# Patient Record
Sex: Female | Born: 1955 | Race: White | Hispanic: No | Marital: Single | Smoking: Former smoker
Health system: Southern US, Community
[De-identification: ages and names within clinical notes are randomized; demographics above are authoritative.]

## PROBLEM LIST (undated history)

## (undated) HISTORY — PX: FOOT SURGERY: SHX648

---

## 2008-06-15 ENCOUNTER — Ambulatory Visit: Payer: Self-pay | Admitting: Internal Medicine

## 2008-07-02 ENCOUNTER — Ambulatory Visit: Payer: Self-pay | Admitting: Internal Medicine

## 2009-01-17 ENCOUNTER — Ambulatory Visit: Payer: Self-pay | Admitting: Internal Medicine

## 2011-01-06 ENCOUNTER — Ambulatory Visit: Payer: Self-pay | Admitting: Internal Medicine

## 2011-04-02 ENCOUNTER — Ambulatory Visit: Payer: Self-pay | Admitting: Internal Medicine

## 2013-07-10 ENCOUNTER — Ambulatory Visit: Payer: Self-pay | Admitting: Podiatry

## 2014-07-02 ENCOUNTER — Ambulatory Visit: Payer: Self-pay | Admitting: Podiatry

## 2014-12-24 ENCOUNTER — Emergency Department
Admission: EM | Admit: 2014-12-24 | Discharge: 2014-12-24 | Disposition: A | Discharge status: Home / Self Care | Payer: 59 | Attending: Emergency Medicine | Admitting: Emergency Medicine

## 2014-12-24 ENCOUNTER — Encounter: Payer: Self-pay | Admitting: Urgent Care

## 2014-12-24 DIAGNOSIS — R51 Headache: Secondary | ICD-10-CM | POA: Insufficient documentation

## 2014-12-24 DIAGNOSIS — R040 Epistaxis: Secondary | ICD-10-CM | POA: Insufficient documentation

## 2014-12-24 LAB — CBC WITH DIFFERENTIAL/PLATELET
Basophils Absolute: 0.1 10*3/uL (ref 0–0.1)
Basophils Relative: 1 %
Eosinophils Absolute: 0.1 10*3/uL (ref 0–0.7)
Eosinophils Relative: 1 %
HCT: 42.2 % (ref 35.0–47.0)
HEMOGLOBIN: 14 g/dL (ref 12.0–16.0)
LYMPHS ABS: 3 10*3/uL (ref 1.0–3.6)
Lymphocytes Relative: 31 %
MCH: 32.7 pg (ref 26.0–34.0)
MCHC: 33.2 g/dL (ref 32.0–36.0)
MCV: 98.6 fL (ref 80.0–100.0)
MONOS PCT: 9 %
Monocytes Absolute: 0.8 10*3/uL (ref 0.2–0.9)
NEUTROS PCT: 58 %
Neutro Abs: 5.8 10*3/uL (ref 1.4–6.5)
PLATELETS: 262 10*3/uL (ref 150–440)
RBC: 4.28 MIL/uL (ref 3.80–5.20)
RDW: 13.4 % (ref 11.5–14.5)
WBC: 9.7 10*3/uL (ref 3.6–11.0)

## 2014-12-24 LAB — COMPREHENSIVE METABOLIC PANEL
ALBUMIN: 4.5 g/dL (ref 3.5–5.0)
ALK PHOS: 83 U/L (ref 38–126)
ALT: 15 U/L (ref 14–54)
AST: 22 U/L (ref 15–41)
Anion gap: 9 (ref 5–15)
BUN: 17 mg/dL (ref 6–20)
CHLORIDE: 105 mmol/L (ref 101–111)
CO2: 27 mmol/L (ref 22–32)
Calcium: 9.7 mg/dL (ref 8.9–10.3)
Creatinine, Ser: 0.77 mg/dL (ref 0.44–1.00)
GFR calc Af Amer: >60 mL/min (ref >60)
Glucose, Bld: 103 mg/dL — ABNORMAL HIGH (ref 65–99)
POTASSIUM: 3.8 mmol/L (ref 3.5–5.1)
Sodium: 141 mmol/L (ref 135–145)
Total Bilirubin: 0.3 mg/dL (ref 0.3–1.2)
Total Protein: 7.3 g/dL (ref 6.5–8.1)

## 2014-12-24 LAB — PROTIME-INR
INR: 0.98
PROTHROMBIN TIME: 13.2 s (ref 11.4–15.0)

## 2014-12-24 MED ORDER — OXYMETAZOLINE HCL 0.05 % NA SOLN: 1.0000 | Freq: Once | NASAL | Status: DC

## 2014-12-24 MED ORDER — OXYMETAZOLINE HCL 0.05 % NA SOLN
NASAL | Status: AC
  Filled 2014-12-24: qty 15

## 2014-12-24 NOTE — ED Notes (Signed)
Patient present to ED with complaint of nose bleed beginning at 7 pm tonight. Patient reports has been having nose bleed on and off since yesterday. Was seen at [facility] today for same complaint, states bleeding was controlled but began again tonight. Patient report had "tingling in my head and left arm feels heavy, like I worked out." Denies chest pain, shortness of breath or fevers. Patient blood pressure elevated tonight, denies history of HTN. Reports is not on blood thinners. Patient coughing up blood clots during assessment. Patient alert and oriented X4, respirations even and unlabored.

## 2014-12-24 NOTE — ED Notes (Signed)
MD at bedside. 

## 2014-12-24 NOTE — ED Notes (Signed)

## 2014-12-24 NOTE — ED Notes (Addendum)
Patient presents with active epistaxis event; this one started at 1900. Patient actually started last night and has had intermittent bleeding all day. That of which [facility]; given Afrin. Patient reports that she is bleeding only from the LEFT nare.

## 2014-12-24 NOTE — ED Notes (Signed)
Patient states does not want her nose packet, patient made aware she will possibly have episodes of bleeding, informed patient to place nose clamp when bleeding. Patient verbalized understanding to follow up with ENT doctor. Patient states has Afrin spray for [facility] which was given to her earlier today for nose bleed. NAD noted.

## 2014-12-24 NOTE — ED Provider Notes (Signed)
[facility] Emergency Department Provider Note     Time seen: ----------------------------------------- 9:41 PM on 12/24/2014 -----------------------------------------    I have reviewed the triage vital signs and the nursing notes.   HISTORY  Chief Complaint Epistaxis    HPI Lisa Goodwin is a 59 y.o. female who presents to ER after a nosebleed beginning at 7 PM tonight. Patient reports being seen in urgent care earlier today, she said that Afrin was applied to the naris which resulted in hemostasis at that time. However this evening bleeding recurred. She does not have a history of epistaxis, takes no blood thinners, notes her blood pressure has been elevated. She also states she's had some tingling in her head and her left arm feels heavy like she has worked out. Denies any other complaints. Denies history of same.  History reviewed. No pertinent past medical history.  There are no active problems to display for this patient.   History reviewed. No pertinent past surgical history.  Allergies Review of patient's allergies indicates no known allergies.  Social History History  Substance Use Topics  . Smoking status: Never Smoker   . Smokeless tobacco: Not on file  . Alcohol Use: No    Review of Systems Constitutional: Negative for fever. Eyes: Negative for visual changes. ENT: Negative for sore throat. Positive for epistaxis from the left nare Cardiovascular: Negative for chest pain. Respiratory: Negative for shortness of breath. Gastrointestinal: Negative for abdominal pain, vomiting and diarrhea. Genitourinary: Negative for dysuria. Musculoskeletal: Negative for back pain. Skin: Negative for rash. Neurological: Positive for headache, tingling.  10-point ROS otherwise negative.  ____________________________________________   PHYSICAL EXAM:  VITAL SIGNS: ED Triage Vitals  Enc Vitals Group     BP 12/24/14 2133 167/145 mmHg      Pulse Rate 12/24/14 2133 84     Resp 12/24/14 2133 18     Temp 12/24/14 2133 98.2 F (36.8 C)     Temp Source 12/24/14 2133 Oral     SpO2 12/24/14 2133 98 %     Weight 12/24/14 2133 110 lb (49.896 kg)     Height 12/24/14 2133  (1.651 m)     Head Cir --      Peak Flow --      Pain Score 12/24/14 2134 0     Pain Loc --      Pain Edu? --      Excl. in GC? --     Constitutional: Alert and oriented. Well appearing and in no distress. Eyes: Conjunctivae are normal. PERRL. Normal extraocular movements. ENT   Head: Normocephalic and atraumatic.   Nose: Mild active bleeding noted coming from the left naris   Mouth/Throat: Mucous membranes are moist.   Neck: No stridor. Hematological/Lymphatic/Immunilogical: No cervical lymphadenopathy. Cardiovascular: Normal rate, regular rhythm. Normal and symmetric distal pulses are present in all extremities. No murmurs, rubs, or gallops. Respiratory: Normal respiratory effort without tachypnea nor retractions. Breath sounds are clear and equal bilaterally. No wheezes/rales/rhonchi. Gastrointestinal: Soft and nontender. No distention. No abdominal bruits. There is no CVA tenderness. Musculoskeletal: Nontender with normal range of motion in all extremities. No joint effusions.  No lower extremity tenderness nor edema. Neurologic:  Normal speech and language. No gross focal neurologic deficits are appreciated. Speech is normal. No gait instability. Skin:  Skin is warm, dry and intact. No rash noted. Psychiatric: Mood and affect are normal. Speech and behavior are normal. Patient exhibits appropriate insight and judgment. ____________________________________________  ED COURSE:  Pertinent labs & imaging results that were available during my care of the patient were reviewed by me and considered in my medical decision making (see chart for details). Patient with Afrin reapplied to the bilateral nasal passages and pressure  externally. ____________________________________________    LABS (pertinent positives/negatives)  Labs Reviewed  COMPREHENSIVE METABOLIC PANEL - Abnormal; Notable for the following:    Glucose, Bld 103 (*)    All other components within normal limits  CBC WITH DIFFERENTIAL/PLATELET  PROTIME-INR    RADIOLOGY None  ____________________________________________  FINAL ASSESSMENT AND PLAN  Epistaxis  Plan: Patient with Afrin applied earlier, after blowing clots out. Pressure was then applied, she was reexamined. She has not had any further bleeding, the bleeding site was not identified by me. She is advised to follow-up with ENT tomorrow rather than it being packed currently.   Emily FilbertWilliams, Nima Bamburg E, MD   Emily FilbertJonathan E Terrance Lanahan, MD 12/24/14 [phone]

## 2014-12-24 NOTE — Discharge Instructions (Signed)

## 2014-12-24 NOTE — ED Notes (Signed)
Patient reports nose began to bleed, states she used Afrin spray in nose.

## 2015-04-09 ENCOUNTER — Ambulatory Visit (INDEPENDENT_AMBULATORY_CARE_PROVIDER_SITE_OTHER): Payer: 59

## 2015-04-09 ENCOUNTER — Ambulatory Visit (INDEPENDENT_AMBULATORY_CARE_PROVIDER_SITE_OTHER): Payer: 59 | Admitting: Podiatry

## 2015-04-09 DIAGNOSIS — R52 Pain, unspecified: Secondary | ICD-10-CM

## 2015-04-09 DIAGNOSIS — M205X9 Other deformities of toe(s) (acquired), unspecified foot: Secondary | ICD-10-CM | POA: Diagnosis not present

## 2015-04-09 DIAGNOSIS — L84 Corns and callosities: Secondary | ICD-10-CM | POA: Diagnosis not present

## 2015-04-09 DIAGNOSIS — M2010 Hallux valgus (acquired), unspecified foot: Secondary | ICD-10-CM

## 2015-04-09 DIAGNOSIS — M2040 Other hammer toe(s) (acquired), unspecified foot: Secondary | ICD-10-CM

## 2015-04-09 NOTE — Progress Notes (Signed)
   Subjective:    Patient ID: Lisa Goodwin, female    DOB: Dec 23, 1955, 59 y.o.   MRN: [id number]  HPI  Patient presents the office with concerns of a corn, right fifth toe which she would like to have removed. She states that she has painted the area particularly with pressure in shoe gear. She denies any redness or swelling around the area and denies any drainage. She is applied pads previously without much relief and she is also tried changing her shoes. No other complaints at this time.   Review of Systems  All other systems reviewed and are negative.      Objective:   Physical Exam General: AAO x3, NAD  Dermatological: Skin is warm, dry and supple bilateral. Nails x 10 are well manicured; remaining integument appears unremarkable at this time. There are no open sores. There is a hyperkeratotic lesion on the right dorsal lateral portion of the right fifth toe. Upon debridement no underlying ulceration, drainage or other signs of infection. There is also diffuse hyperkeratotic lesions on the metatarsal heads.  Vascular: Dorsalis Pedis artery and Posterior Tibial artery pedal pulses are 2/4 bilateral with immedate capillary fill time. Pedal hair growth present. No varicosities and no lower extremity edema present bilateral. There is no pain with calf compression, swelling, warmth, erythema.   Neruologic: Grossly intact via light touch bilateral. Vibratory intact via tuning fork bilateral. Protective threshold with Semmes Wienstein monofilament intact to all pedal sites bilateral.   Musculoskeletal: Adductovarus of bilateral fifth digits with an associated hyperkeratotic lesion on the right fifth toe. There is hammertoe contractures and bunions bilaterally. There is tenderness on the corn overlying the right fifth toe. No other areas of tenderness to bilateral lower extremities. No pain, crepitus, or limitation noted with foot and ankle range of motion bilateral. Muscular strength 5/5 in  all groups tested bilateral.  Gait: Unassisted, Nonantalgic.       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  -Treatment options discussed including all alternatives, risks, and complications -Etiology of symptoms were discussed -I discussed both conservative and surgical treatment options. She got that the corn removed. I debrided the corn without any complication/bleeding. I discussed her the best option to remove this would be to have surgery. She does not want to do surgery at this time. For now continue his shoe gear modifications, padding. Offloading pads were dispensed. -Follow-up of symptoms continue or worsen. In the meantime call the office with any questions, concerns, change in symptoms.  Ovid CurdMatthew Wagoner, DPM

## 2015-08-24 ENCOUNTER — Encounter: Payer: Self-pay | Admitting: Podiatry

## 2015-08-24 ENCOUNTER — Ambulatory Visit (INDEPENDENT_AMBULATORY_CARE_PROVIDER_SITE_OTHER): Payer: 59 | Admitting: Podiatry

## 2015-08-24 VITALS — BP 151/96 | HR 72 | Resp 12

## 2015-08-24 DIAGNOSIS — M2040 Other hammer toe(s) (acquired), unspecified foot: Secondary | ICD-10-CM | POA: Diagnosis not present

## 2015-08-24 DIAGNOSIS — M778 Other enthesopathies, not elsewhere classified: Secondary | ICD-10-CM

## 2015-08-24 DIAGNOSIS — M7751 Other enthesopathy of right foot: Secondary | ICD-10-CM | POA: Diagnosis not present

## 2015-08-24 DIAGNOSIS — M2010 Hallux valgus (acquired), unspecified foot: Secondary | ICD-10-CM

## 2015-08-24 DIAGNOSIS — M779 Enthesopathy, unspecified: Secondary | ICD-10-CM

## 2015-08-24 NOTE — Progress Notes (Signed)
Lisa Goodwin presents today for surgical consult regarding her right foot. 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 She states that shoe gear is becoming problematic and the pain to the fifth metatarsophalangeal joint is worse. She denies changes in her past medical history medications allergies surgeries and social history.  Objective: Vital signs are stable alert and oriented 3. Pulses are strongly palpable. Neurologic sensorium is intact per Semmes-Weinstein monofilament. Deep tendon reflexes are intact. Muscle strength +5 over 5 dorsiflexion plantar flexors and inverters everters on his musculature is intact. Orthopedic evaluation demonstrates hallux abductovalgus deformity right foot elongated plantarflexed second metatarsal with rigid hammertoe deformity at the level of the PIPJ second digit right foot as well as a adductor varus rotated hammertoe deformity fifth digit right foot. She also has pain on palpation of fifth digit of the right foot with an overlying reactive hyperkeratotic lesion. I reviewed the radiographs previously taken today.  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 Plantarflexed elongated second metatarsal right foot. Hammertoe fifth digit right foot.  Plan: Discussed etiology and pathology conservative versus surgical therapies. At this point we went over a consent form today line by line number by number giving her ample time to ask questions she saw fit regarding an Austin bunion repair with screw fixation a second metatarsal osteotomy with screw fixation hammertoe repair second digit with pin fixation and a derotational arthroplasty fifth digit right foot. I answered all the questions regarding these procedures to the best of my ability in layman's terms. She understood  this was amenable to it and signed off the consent form. We did discuss the possible postop complications which may include but are not limited to postoperative pain bleeding swelling infection recurrence and need for further surgery over correction under correction loss of digit loss of limb loss of life. We dispensed a cam walker today. I also injected her fifth digit today with dexamethasone and local anesthetic and debridement and reactive hyperkeratotic tissue. She will follow up with me in the near future for surgery. I also provided her with a written note to help with her work issues as far as standing for long periods of time and wearing appropriate shoe gear.

## 2015-09-01 ENCOUNTER — Telehealth: Payer: Self-pay | Admitting: *Deleted

## 2015-09-01 NOTE — Telephone Encounter (Signed)
I left patient messages to call me back.  I need to get her scheduled for surgery.

## 2015-09-02 NOTE — Telephone Encounter (Signed)
"  I'm returning your call from yesterday."  I was calling to see if you wanted to schedule surgery with Dr. Al CorpusHyatt.  "Yes, I'd like to do it in April.  Does he have anything available?"  He can do it on April 7, 21 or 28.  "Let's schedule it for April 28.  Is there anything else I need to do?"  You need to register with the surgical center.  Instructions are in the surgery center brochure.  "Okay, thank you so much."

## 2015-09-04 ENCOUNTER — Encounter: Payer: Self-pay | Admitting: Family Medicine

## 2015-09-04 ENCOUNTER — Ambulatory Visit (INDEPENDENT_AMBULATORY_CARE_PROVIDER_SITE_OTHER): Payer: 59 | Admitting: Family Medicine

## 2015-09-04 VITALS — BP 140/80 | HR 88 | Ht 65.0 in | Wt 107.0 lb

## 2015-09-04 DIAGNOSIS — M5431 Sciatica, right side: Secondary | ICD-10-CM

## 2015-09-04 DIAGNOSIS — Z7189 Other specified counseling: Secondary | ICD-10-CM

## 2015-09-04 DIAGNOSIS — Z7689 Persons encountering health services in other specified circumstances: Secondary | ICD-10-CM

## 2015-09-04 DIAGNOSIS — M5136 Other intervertebral disc degeneration, lumbar region: Secondary | ICD-10-CM | POA: Diagnosis not present

## 2015-09-04 MED ORDER — TRAMADOL HCL 50 MG PO TABS
50.0000 mg | ORAL_TABLET | Freq: Three times a day (TID) | ORAL | Status: DC | PRN
Start: 2015-09-04 — End: 2016-07-28

## 2015-09-04 MED ORDER — PREDNISONE 10 MG PO TABS: 10.0000 mg | ORAL_TABLET | Freq: Every day | ORAL | Status: DC

## 2015-09-04 NOTE — Progress Notes (Signed)
Name: Lisa OverlandClaudia V Dettmer   MRN: [id number]    DOB: [birth year]   Date:09/04/2015       Progress Note  Subjective  Chief Complaint  Chief Complaint  Patient presents with  . Establish Care  . Leg Pain    upper R) leg pain that feels like "a catch"- works on a farm lifting hay. Has a therapeutic massage next week    Leg Pain  The incident occurred more than 1 week ago. The incident occurred at home. The injury mechanism was a twisting injury. The pain is present in the right thigh. The quality of the pain is described as aching. The pain is moderate. The pain has been fluctuating since onset. Associated symptoms include tingling. Pertinent negatives include no inability to bear weight, loss of motion, loss of sensation, muscle weakness or numbness. She reports no foreign bodies present. She has tried acetaminophen and NSAIDs for the symptoms. The treatment provided no relief.    No problem-specific assessment & plan notes found for this encounter.   History reviewed. No pertinent past medical history.  History reviewed. No pertinent past surgical history.  Family History  Problem Relation Age of Onset  . Cancer Mother   . Hypertension Brother     Social History   Social History  . Marital Status: Single    Spouse Name: N/A  . Number of Children: N/A  . Years of Education: N/A   Occupational History  . Not on file.   Social History Main Topics  . Smoking status: Never Smoker   . Smokeless tobacco: Not on file  . Alcohol Use: No  . Drug Use: Not on file  . Sexual Activity: Yes   Other Topics Concern  . Not on file   Social History Narrative    No Known Allergies   Review of Systems  Constitutional: Negative for fever, chills, weight loss and malaise/fatigue.  HENT: Negative for ear discharge, ear pain and sore throat.   Eyes: Negative for blurred vision.  Respiratory: Negative for cough, sputum production, shortness of breath and wheezing.   Cardiovascular:  Negative for chest pain, palpitations and leg swelling.  Gastrointestinal: Negative for heartburn, nausea, abdominal pain, diarrhea, constipation, blood in stool and melena.  Genitourinary: Negative for dysuria, urgency, frequency and hematuria.  Musculoskeletal: Negative for myalgias, back pain, joint pain and neck pain.  Skin: Negative for rash.  Neurological: Positive for tingling. Negative for dizziness, sensory change, focal weakness, numbness and headaches.  Endo/Heme/Allergies: Negative for environmental allergies and polydipsia. Does not bruise/bleed easily.  Psychiatric/Behavioral: Negative for depression and suicidal ideas. The patient is not nervous/anxious and does not have insomnia.      Objective  Filed Vitals:   09/04/15 1448  BP: 140/80  Pulse: 88  Height: 5\' 5"  (1.651 m)  Weight: 107 lb (48.535 kg)    Physical Exam  Constitutional: She is well-developed, well-nourished, and in no distress. No distress.  HENT:  Head: Normocephalic and atraumatic.  Right Ear: External ear normal.  Left Ear: External ear normal.  Nose: Nose normal.  Mouth/Throat: Oropharynx is clear and moist.  Eyes: Conjunctivae and EOM are normal. Pupils are equal, round, and reactive to light. Right eye exhibits no discharge. Left eye exhibits no discharge.  Neck: Normal range of motion. Neck supple. No JVD present. No thyromegaly present.  Cardiovascular: Normal rate, regular rhythm, normal heart sounds and intact distal pulses.  Exam reveals no gallop and no friction rub.   No murmur heard. Pulmonary/Chest:  Effort normal and breath sounds normal.  Abdominal: Soft. Bowel sounds are normal. She exhibits no mass. There is no tenderness. There is no guarding.  Musculoskeletal: Normal range of motion. She exhibits no edema.  Lymphadenopathy:    She has no cervical adenopathy.  Neurological: She is alert. She has normal strength and normal reflexes.  Skin: Skin is warm and dry. She is not  diaphoretic.  Psychiatric: Mood and affect normal.  Nursing note and vitals reviewed.     Assessment & Plan  Problem List Items Addressed This Visit    None    Visit Diagnoses    Encounter to establish care    -  Primary    Sciatica of right side        Relevant Medications    traMADol (ULTRAM) 50 MG tablet    predniSONE (DELTASONE) 10 MG tablet    Degenerative disc disease, lumbar        Relevant Medications    traMADol (ULTRAM) 50 MG tablet    predniSONE (DELTASONE) 10 MG tablet         Dr. [facility]  09/04/2015

## 2015-09-04 NOTE — Patient Instructions (Signed)

## 2015-09-28 DIAGNOSIS — M9903 Segmental and somatic dysfunction of lumbar region: Secondary | ICD-10-CM | POA: Diagnosis not present

## 2015-09-28 DIAGNOSIS — M9905 Segmental and somatic dysfunction of pelvic region: Secondary | ICD-10-CM | POA: Diagnosis not present

## 2015-09-28 DIAGNOSIS — M5431 Sciatica, right side: Secondary | ICD-10-CM | POA: Diagnosis not present

## 2015-09-28 DIAGNOSIS — M5416 Radiculopathy, lumbar region: Secondary | ICD-10-CM | POA: Diagnosis not present

## 2015-10-01 DIAGNOSIS — M5416 Radiculopathy, lumbar region: Secondary | ICD-10-CM | POA: Diagnosis not present

## 2015-10-01 DIAGNOSIS — M9903 Segmental and somatic dysfunction of lumbar region: Secondary | ICD-10-CM | POA: Diagnosis not present

## 2015-10-01 DIAGNOSIS — M5431 Sciatica, right side: Secondary | ICD-10-CM | POA: Diagnosis not present

## 2015-10-01 DIAGNOSIS — M9905 Segmental and somatic dysfunction of pelvic region: Secondary | ICD-10-CM | POA: Diagnosis not present

## 2015-10-02 DIAGNOSIS — M5416 Radiculopathy, lumbar region: Secondary | ICD-10-CM | POA: Diagnosis not present

## 2015-10-02 DIAGNOSIS — M5431 Sciatica, right side: Secondary | ICD-10-CM | POA: Diagnosis not present

## 2015-10-02 DIAGNOSIS — M9903 Segmental and somatic dysfunction of lumbar region: Secondary | ICD-10-CM | POA: Diagnosis not present

## 2015-10-02 DIAGNOSIS — M9905 Segmental and somatic dysfunction of pelvic region: Secondary | ICD-10-CM | POA: Diagnosis not present

## 2015-10-06 ENCOUNTER — Telehealth: Payer: Self-pay | Admitting: *Deleted

## 2015-10-06 NOTE — Telephone Encounter (Signed)
I left patient messages to call me.  I need to get updated insurance information.  She's scheduled for surgery on 10/23/2015.

## 2015-10-07 NOTE — Telephone Encounter (Signed)
Chip BoerVicki updated patient's insurance.

## 2015-10-07 NOTE — Telephone Encounter (Signed)
"  I received your call this morning.  I'm scheduled for surgery on the 28th with Dr. Al CorpusHyatt.  I'm going to give you my card information.  My company has switched to [employer].  My id number is [id number].  That is with [employer].  Group number is [id number].  My social security number is [id number].  Call me if you have any further questions."

## 2015-10-14 ENCOUNTER — Telehealth: Payer: Self-pay | Admitting: *Deleted

## 2015-10-14 NOTE — Telephone Encounter (Signed)
"  I'm calling to make sure you got all the information you needed about my insurance.  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  "Do I need to call them to set up the time?"  No, you do not need to call them.  They will call you a day or two prior to date with the arrival time.  "Okay, thank you so much."

## 2015-10-20 ENCOUNTER — Other Ambulatory Visit: Payer: Self-pay | Admitting: Podiatry

## 2015-10-20 MED ORDER — OXYCODONE-ACETAMINOPHEN 10-325 MG PO TABS: ORAL_TABLET | ORAL | Status: DC

## 2015-10-20 MED ORDER — PROMETHAZINE HCL 25 MG PO TABS: 25.0000 mg | ORAL_TABLET | Freq: Three times a day (TID) | ORAL | Status: DC | PRN

## 2015-10-20 MED ORDER — CEPHALEXIN 500 MG PO CAPS: 500.0000 mg | ORAL_CAPSULE | Freq: Three times a day (TID) | ORAL | Status: DC

## 2015-10-23 ENCOUNTER — Encounter: Payer: Self-pay | Admitting: Podiatry

## 2015-10-23 DIAGNOSIS — M21541 Acquired clubfoot, right foot: Secondary | ICD-10-CM | POA: Diagnosis not present

## 2015-10-23 DIAGNOSIS — M5431 Sciatica, right side: Secondary | ICD-10-CM | POA: Diagnosis not present

## 2015-10-23 DIAGNOSIS — M2011 Hallux valgus (acquired), right foot: Secondary | ICD-10-CM | POA: Diagnosis not present

## 2015-10-23 DIAGNOSIS — M2041 Other hammer toe(s) (acquired), right foot: Secondary | ICD-10-CM | POA: Diagnosis not present

## 2015-10-23 DIAGNOSIS — M216X1 Other acquired deformities of right foot: Secondary | ICD-10-CM | POA: Diagnosis not present

## 2015-10-23 DIAGNOSIS — M25571 Pain in right ankle and joints of right foot: Secondary | ICD-10-CM | POA: Diagnosis not present

## 2015-10-23 NOTE — Progress Notes (Signed)
DOS 10/23/2015 [place] Bunion Repair right foot; 2nd metatarsal osteotomy with screw' hammer toe repair 2nd, and 5th right possible pin or screw.

## 2015-10-26 ENCOUNTER — Telehealth: Payer: Self-pay | Admitting: *Deleted

## 2015-10-26 NOTE — Telephone Encounter (Signed)
Post op courtesy call-Pt states she started her pain medication on Friday, but hasn't needed any since.  Pt asked if that was normal and I said yes for Dr. Al CorpusHyatt pts.  I reinformed RICE, and not being on her foot or dangling more than 15 mins/hour, to leave the original dressing in place until it is changed by Dr. Al CorpusHyatt at her 1st POV [place] 10[place]28/2015 [place].  Pt states understanding.

## 2015-10-28 ENCOUNTER — Ambulatory Visit (INDEPENDENT_AMBULATORY_CARE_PROVIDER_SITE_OTHER): Payer: BLUE CROSS/BLUE SHIELD

## 2015-10-28 ENCOUNTER — Ambulatory Visit (INDEPENDENT_AMBULATORY_CARE_PROVIDER_SITE_OTHER): Payer: BLUE CROSS/BLUE SHIELD | Admitting: Podiatry

## 2015-10-28 DIAGNOSIS — M2011 Hallux valgus (acquired), right foot: Secondary | ICD-10-CM

## 2015-10-28 DIAGNOSIS — M2041 Other hammer toe(s) (acquired), right foot: Secondary | ICD-10-CM | POA: Diagnosis not present

## 2015-10-28 DIAGNOSIS — Z9889 Other specified postprocedural states: Secondary | ICD-10-CM | POA: Diagnosis not present

## 2015-10-28 NOTE — Progress Notes (Signed)
She presents today for her first postop visit status [facility] second metatarsal osteotomy with hammertoe repair second right and hammertoe repair fifth right. She states that she only had to take 2 days worth of the medications area she states that this is very hard for her to stay off of her foot and keep it elevated. She presents today with her brother. She denies fever chills nausea vomiting muscle aches and pains. Denies chest pain or shortness of breath.  Objective: Vital signs stable alert and oriented 3 pulses are palpable. neurologic dry sterile dressing was intact was removed demonstrates moderate edema no cellulitis drainage or odor sutures are intact margins are well coapted. Radiographs confirm O osteotomies are in good position with internal fixation as is the pin in the second toe as well as the derotation of the fifth toe.  Assessment: Well-healing surgical foot right.  Plan: Redressed today dresser compressive dressing continue all current therapies. This elevated dry and clean follow up with me in 1 week for suture removal.

## 2015-10-29 ENCOUNTER — Encounter: Payer: Self-pay | Admitting: Podiatry

## 2015-11-04 ENCOUNTER — Encounter: Payer: Self-pay | Admitting: Podiatry

## 2015-11-05 ENCOUNTER — Encounter: Payer: Self-pay | Admitting: Podiatry

## 2015-11-05 ENCOUNTER — Ambulatory Visit (INDEPENDENT_AMBULATORY_CARE_PROVIDER_SITE_OTHER): Payer: BLUE CROSS/BLUE SHIELD | Admitting: Podiatry

## 2015-11-05 DIAGNOSIS — M2041 Other hammer toe(s) (acquired), right foot: Secondary | ICD-10-CM

## 2015-11-05 DIAGNOSIS — Z9889 Other specified postprocedural states: Secondary | ICD-10-CM

## 2015-11-05 DIAGNOSIS — M2011 Hallux valgus (acquired), right foot: Secondary | ICD-10-CM

## 2015-11-08 DIAGNOSIS — M2010 Hallux valgus (acquired), unspecified foot: Secondary | ICD-10-CM | POA: Insufficient documentation

## 2015-11-08 DIAGNOSIS — M2040 Other hammer toe(s) (acquired), unspecified foot: Secondary | ICD-10-CM | POA: Insufficient documentation

## 2015-11-08 NOTE — Progress Notes (Signed)
Patient ID: Lisa OverlandClaudia V Goodwin, female   DOB: Oct 14, 1955, 60 y.o.   MRN: [id number]  Subjective: Lisa OverlandClaudia V Goodwin is a 60 y.o. is seen today in office s/p right foot bunion and metatarsal osteotomies with hammertoe repair. She states that she is doing well. She has remained an offloading shoe. She has not been taking pain medicine. She states that she is doing well. No recent injury. Denies any systemic complaints such as fevers, chills, nausea, vomiting. No calf pain, chest pain, shortness of breath.   Objective: General: No acute distress, AAOx3  DP/PT pulses palpable 2/4, CRT < 3 sec to all digits.  Protective sensation intact. Motor function intact.  Right foot: Incision is well coapted without any evidence of dehiscence and sutures itnact. There is no surrounding erythema, ascending cellulitis, fluctuance, crepitus, malodor, drainage/purulence. There is minimal edema around the surgical site. There is no pain along the surgical site.  No other areas of tenderness to bilateral lower extremities.  No other open lesions or pre-ulcerative lesions.  No pain with calf compression, swelling, warmth, erythema.   Assessment and Plan:  Status post right foot surgery, doing well with no complications   -Treatment options discussed including all alternatives, risks, and complications -Sutures were removed today without complications. Antibiotic ointment was applied followed by dressing. -Ice/elevation -Pain medication as needed. -Monitor for any clinical signs or symptoms of infection and DVT/PE and directed to call the office immediately should any occur or go to the ER. -Follow-up in 2 weeks with Dr. Al CorpusHyatt or sooner if any problems arise. In the meantime, encouraged to call the office with any questions, concerns, change in symptoms.   Ovid CurdMatthew Wagoner, DPM

## 2015-11-16 ENCOUNTER — Telehealth: Payer: Self-pay | Admitting: *Deleted

## 2015-11-16 NOTE — Telephone Encounter (Signed)
Lisa AguasHoward called regarding pt and states that he didn't receive page one of the paperwork that was faxed. He only received page two-which was the attending physician statement.

## 2015-11-18 ENCOUNTER — Ambulatory Visit (INDEPENDENT_AMBULATORY_CARE_PROVIDER_SITE_OTHER): Payer: BLUE CROSS/BLUE SHIELD

## 2015-11-18 ENCOUNTER — Encounter: Payer: Self-pay | Admitting: Podiatry

## 2015-11-18 ENCOUNTER — Ambulatory Visit (INDEPENDENT_AMBULATORY_CARE_PROVIDER_SITE_OTHER): Payer: BLUE CROSS/BLUE SHIELD | Admitting: Podiatry

## 2015-11-18 VITALS — BP 157/98 | HR 64 | Resp 16

## 2015-11-18 DIAGNOSIS — Z9889 Other specified postprocedural states: Secondary | ICD-10-CM

## 2015-11-18 DIAGNOSIS — M2041 Other hammer toe(s) (acquired), right foot: Secondary | ICD-10-CM | POA: Diagnosis not present

## 2015-11-18 DIAGNOSIS — M2011 Hallux valgus (acquired), right foot: Secondary | ICD-10-CM

## 2015-11-18 NOTE — Progress Notes (Signed)
She presents today one month status post Austin bunion repair right hammertoe repair second right and fifth right. She states that she is doing very well. K wire is retained second digit right.  Objective: Vital signs are stable alert and oriented 3 minimal edema no erythema cellulitis drainage or odor she has great range of motion of the first metatarsophalangeal joint and radiographs confirm good placement of internal fixation as well as the K wire.  Assessment: Well-healing surgical foot right.  Plan: I will allow her to start cleaning the foot on a regular basis but we are going to continue to use of a Darco shoe and a compression anklet. I do not want the pin submerged under water. I will follow-up with her in 2 weeks at which time radiographs will be taken hopefully we will be able to remove the K wire that time.

## 2015-11-24 ENCOUNTER — Emergency Department
Admission: EM | Admit: 2015-11-24 | Discharge: 2015-11-24 | Disposition: A | Discharge status: Home / Self Care | Payer: BLUE CROSS/BLUE SHIELD | Attending: Emergency Medicine | Admitting: Emergency Medicine

## 2015-11-24 ENCOUNTER — Emergency Department: Payer: BLUE CROSS/BLUE SHIELD

## 2015-11-24 DIAGNOSIS — Y999 Unspecified external cause status: Secondary | ICD-10-CM | POA: Diagnosis not present

## 2015-11-24 DIAGNOSIS — M79674 Pain in right toe(s): Secondary | ICD-10-CM | POA: Diagnosis not present

## 2015-11-24 DIAGNOSIS — Y9389 Activity, other specified: Secondary | ICD-10-CM | POA: Insufficient documentation

## 2015-11-24 DIAGNOSIS — Y929 Unspecified place or not applicable: Secondary | ICD-10-CM | POA: Insufficient documentation

## 2015-11-24 DIAGNOSIS — T7411XA Adult physical abuse, confirmed, initial encounter: Secondary | ICD-10-CM | POA: Diagnosis not present

## 2015-11-24 DIAGNOSIS — M79671 Pain in right foot: Secondary | ICD-10-CM | POA: Diagnosis not present

## 2015-11-24 DIAGNOSIS — Z79899 Other long term (current) drug therapy: Secondary | ICD-10-CM | POA: Diagnosis not present
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
## 2015-12-02 ENCOUNTER — Encounter: Payer: Self-pay | Admitting: Podiatry

## 2015-12-02 ENCOUNTER — Ambulatory Visit (INDEPENDENT_AMBULATORY_CARE_PROVIDER_SITE_OTHER): Payer: BLUE CROSS/BLUE SHIELD | Admitting: Podiatry

## 2015-12-02 ENCOUNTER — Ambulatory Visit (INDEPENDENT_AMBULATORY_CARE_PROVIDER_SITE_OTHER): Payer: BLUE CROSS/BLUE SHIELD

## 2015-12-02 VITALS — BP 161/91 | HR 74 | Resp 16

## 2015-12-02 DIAGNOSIS — M2041 Other hammer toe(s) (acquired), right foot: Secondary | ICD-10-CM | POA: Diagnosis not present

## 2015-12-02 DIAGNOSIS — M2011 Hallux valgus (acquired), right foot: Secondary | ICD-10-CM | POA: Diagnosis not present

## 2015-12-02 DIAGNOSIS — Z9889 Other specified postprocedural states: Secondary | ICD-10-CM

## 2015-12-02 NOTE — Progress Notes (Signed)
She presents today 6 weeks status post [place] bunion repair right second metatarsal osteotomy hammertoe repair second right hammertoe repair right. She states that she would like to have the K wire removed at possible.  Objective: Vital signs stable she is alert and oriented 3. Pulses are palpable. Neurologic sensorium is intact. Radiographs demonstrate probable pseudoarthrosis with lack of fusion at the PIPJ otherwise all other osteotomies. Be healing nicely.  Assessment: Well-healing surgical foot right.  Plan: Remove the K wire today instructed her to wear the digital Darco splint at nighttime and that she may get back into a loosefitting. Tennis shoes over the next couple of weeks. I will follow up with her in 1 month

## 2015-12-16 ENCOUNTER — Encounter: Payer: BLUE CROSS/BLUE SHIELD | Admitting: Podiatry

## 2016-01-04 ENCOUNTER — Telehealth: Payer: Self-pay | Admitting: *Deleted

## 2016-01-04 NOTE — Telephone Encounter (Signed)
Lisa Goodwin - [employer] states received an update of pt status on about 06/26/or 27/2017 stating pt could return back to work at [employer]. 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 Howard's direct line [phone].

## 2016-01-11 ENCOUNTER — Ambulatory Visit (INDEPENDENT_AMBULATORY_CARE_PROVIDER_SITE_OTHER): Payer: BLUE CROSS/BLUE SHIELD | Admitting: Podiatry

## 2016-01-11 ENCOUNTER — Encounter: Payer: Self-pay | Admitting: Podiatry

## 2016-01-11 ENCOUNTER — Ambulatory Visit (INDEPENDENT_AMBULATORY_CARE_PROVIDER_SITE_OTHER): Payer: BLUE CROSS/BLUE SHIELD

## 2016-01-11 DIAGNOSIS — Z9889 Other specified postprocedural states: Secondary | ICD-10-CM

## 2016-01-11 DIAGNOSIS — M2041 Other hammer toe(s) (acquired), right foot: Secondary | ICD-10-CM

## 2016-01-11 DIAGNOSIS — M2011 Hallux valgus (acquired), right foot: Secondary | ICD-10-CM

## 2016-01-11 NOTE — Progress Notes (Signed)
She presents today for 3 month follow-up regarding her right foot she is status post [place] bunion repair second metatarsal osteotomy hammertoe repair #2 and #3 of the right foot. She states that it seems to be doing pretty well however I'm getting pain into the anterior and posterior calf. She states that she does not wear her digital splint at bedtime.  Objective: Vital signs are stable alert and oriented 3 pulses are palpable. Neurologic sensorium is intact. Good range of motion of the second metatarsophalangeal joint though it is contracted I was able to stretch it and plantar flex it. She has a relief of pain when I flex the toe. Radiographs do Mr. well-healing osteotomies that she has had some motion at the first metatarsal osteotomy. That appears to be healing. Internal fixation is intact. Good fusion at the PIPJ second toe.  Assessment: Well-healing surgical foot slightly delayed because of edema and working on the foot. And to some degree noncompliance particularly with the toe splint.  Plan: I encouraged range of motion and massage therapy and I will follow-up with her in 6 weeks.

## 2016-02-07 DIAGNOSIS — H1089 Other conjunctivitis: Secondary | ICD-10-CM | POA: Diagnosis not present

## 2016-03-07 DIAGNOSIS — N390 Urinary tract infection, site not specified: Secondary | ICD-10-CM | POA: Diagnosis not present

## 2016-03-14 ENCOUNTER — Ambulatory Visit: Payer: BLUE CROSS/BLUE SHIELD | Admitting: Podiatry

## 2016-06-21 DIAGNOSIS — N390 Urinary tract infection, site not specified: Secondary | ICD-10-CM | POA: Diagnosis not present

## 2016-07-28 ENCOUNTER — Encounter: Payer: Self-pay | Admitting: Family Medicine

## 2016-07-28 ENCOUNTER — Ambulatory Visit (INDEPENDENT_AMBULATORY_CARE_PROVIDER_SITE_OTHER): Payer: BLUE CROSS/BLUE SHIELD | Admitting: Family Medicine

## 2016-07-28 VITALS — BP 120/80 | HR 72 | Ht 65.0 in | Wt 106.0 lb

## 2016-07-28 DIAGNOSIS — Z87448 Personal history of other diseases of urinary system: Secondary | ICD-10-CM

## 2016-07-28 DIAGNOSIS — N3090 Cystitis, unspecified without hematuria: Secondary | ICD-10-CM | POA: Diagnosis not present

## 2016-07-28 MED ORDER — CIPROFLOXACIN HCL 250 MG PO TABS: 250.0000 mg | ORAL_TABLET | Freq: Two times a day (BID) | ORAL | 0 refills | Status: DC

## 2016-07-28 NOTE — Progress Notes (Signed)
Name: Lisa Goodwin   MRN: [id number]    DOB: June 29, 1955   Date:07/28/2016       Progress Note  Subjective  Chief Complaint  Chief Complaint  Patient presents with  . Urinary Tract Infection    taking AZO x 3 days- better but still has pressure at end, burning and "going alot"- had a round of Septra x 7 days in Dec    Urinary Tract Infection   This is a recurrent problem. The current episode started 1 to 4 weeks ago. The problem occurs intermittently. The problem has been waxing and waning. The quality of the pain is described as aching. The pain is at a severity of 2/10 (suprapubic). The pain is mild. There has been no fever. She is not sexually active. There is no history of pyelonephritis. Associated symptoms include frequency, hematuria and urgency. Pertinent negatives include no chills, discharge, flank pain or nausea. She has tried antibiotics for the symptoms. The treatment provided mild relief. Her past medical history is significant for recurrent UTIs. There is no history of catheterization, kidney stones, a single kidney, urinary stasis or a urological procedure.    No problem-specific Assessment & Plan notes found for this encounter.   No past medical history on file.  Past Surgical History:  Procedure Laterality Date  . FOOT SURGERY      Family History  Problem Relation Age of Onset  . Cancer Mother   . Hypertension Brother     Social History   Social History  . Marital status: Single    Spouse name: N/A  . Number of children: N/A  . Years of education: N/A   Occupational History  . Not on file.   Social History Main Topics  . Smoking status: Never Smoker  . Smokeless tobacco: Not on file  . Alcohol use No  . Drug use: Unknown  . Sexual activity: Yes   Other Topics Concern  . Not on file   Social History Narrative  . No narrative on file    No Known Allergies   Review of Systems  Constitutional: Negative for chills, fever, malaise/fatigue  and weight loss.  HENT: Negative for ear discharge, ear pain and sore throat.   Eyes: Negative for blurred vision.  Respiratory: Negative for cough, sputum production, shortness of breath and wheezing.   Cardiovascular: Negative for chest pain, palpitations and leg swelling.  Gastrointestinal: Negative for abdominal pain, blood in stool, constipation, diarrhea, heartburn, melena and nausea.  Genitourinary: Positive for frequency, hematuria and urgency. Negative for dysuria and flank pain.  Musculoskeletal: Negative for back pain, joint pain, myalgias and neck pain.  Skin: Negative for rash.  Neurological: Negative for dizziness, tingling, sensory change, focal weakness and headaches.  Endo/Heme/Allergies: Negative for environmental allergies and polydipsia. Does not bruise/bleed easily.  Psychiatric/Behavioral: Negative for depression and suicidal ideas. The patient is not nervous/anxious and does not have insomnia.      Objective  Vitals:   07/28/16 1044  BP: 120/80  Pulse: 72  Weight: 106 lb (48.1 kg)  Height: 5\' 5"  (1.651 m)    Physical Exam  Constitutional: She is well-developed, well-nourished, and in no distress. No distress.  HENT:  Head: Normocephalic and atraumatic.  Right Ear: External ear normal.  Left Ear: External ear normal.  Nose: Nose normal.  Mouth/Throat: Oropharynx is clear and moist.  Eyes: Conjunctivae and EOM are normal. Pupils are equal, round, and reactive to light. Right eye exhibits no discharge. Left eye exhibits no  discharge.  Neck: Normal range of motion. Neck supple. No JVD present. No thyromegaly present.  Cardiovascular: Normal rate, regular rhythm, normal heart sounds and intact distal pulses.  Exam reveals no gallop and no friction rub.   No murmur heard. Pulmonary/Chest: Effort normal and breath sounds normal. She has no wheezes. She has no rales.  Abdominal: Soft. Bowel sounds are normal. She exhibits no mass. There is tenderness in the  suprapubic area. There is no guarding.  Musculoskeletal: Normal range of motion. She exhibits no edema.  Lymphadenopathy:    She has no cervical adenopathy.  Neurological: She is alert. She has normal reflexes.  Skin: Skin is warm and dry. She is not diaphoretic.  Psychiatric: Mood and affect normal.  Nursing note and vitals reviewed.     Assessment & Plan  Problem List Items Addressed This Visit    None    Visit Diagnoses    Cystitis    -  Primary   Relevant Medications   ciprofloxacin (CIPRO) 250 MG tablet   Other Relevant Orders   Urine Culture   History of hematuria       RTC for U/A for eval    Relevant Medications   ciprofloxacin (CIPRO) 250 MG tablet   Other Relevant Orders   Urine Culture        Dr. Elizabeth Sauereanna Raeshawn Tafolla [facility]  07/28/16

## 2016-07-30 LAB — URINE CULTURE: Organism ID, Bacteria: NO GROWTH

## 2016-07-30 LAB — PLEASE NOTE

## 2017-02-13 DIAGNOSIS — H6980 Other specified disorders of Eustachian tube, unspecified ear: Secondary | ICD-10-CM | POA: Diagnosis not present

## 2017-02-13 DIAGNOSIS — H93299 Other abnormal auditory perceptions, unspecified ear: Secondary | ICD-10-CM | POA: Diagnosis not present

## 2017-02-22 DIAGNOSIS — R300 Dysuria: Secondary | ICD-10-CM | POA: Diagnosis not present

## 2017-04-17 ENCOUNTER — Encounter: Payer: Self-pay | Admitting: Family Medicine

## 2017-04-17 ENCOUNTER — Ambulatory Visit (INDEPENDENT_AMBULATORY_CARE_PROVIDER_SITE_OTHER): Payer: BLUE CROSS/BLUE SHIELD | Admitting: Family Medicine

## 2017-04-17 VITALS — BP 152/84 | HR 60 | Ht 65.0 in | Wt 106.0 lb

## 2017-04-17 DIAGNOSIS — R030 Elevated blood-pressure reading, without diagnosis of hypertension: Secondary | ICD-10-CM | POA: Diagnosis not present

## 2017-04-17 DIAGNOSIS — E860 Dehydration: Secondary | ICD-10-CM

## 2017-04-17 DIAGNOSIS — R42 Dizziness and giddiness: Secondary | ICD-10-CM | POA: Diagnosis not present

## 2017-04-17 DIAGNOSIS — R5383 Other fatigue: Secondary | ICD-10-CM | POA: Diagnosis not present

## 2017-04-17 LAB — POCT CBG (FASTING - GLUCOSE)-MANUAL ENTRY: Glucose Fasting, POC: 97 mg/dL (ref 70–99)

## 2017-04-17 NOTE — Patient Instructions (Signed)
DASH Eating Plan DASH stands for "Dietary Approaches to Stop Hypertension." The DASH eating plan is a healthy eating plan that has been shown to reduce high blood pressure (hypertension). It may also reduce your risk for type 2 diabetes, heart disease, and stroke. The DASH eating plan may also help with weight loss. What are tips for following this plan? General guidelines  Avoid eating more than 2,300 mg (milligrams) of salt (sodium) a day. If you have hypertension, you may need to reduce your sodium intake to 1,500 mg a day.  Limit alcohol intake to no more than 1 drink a day for nonpregnant women and 2 drinks a day for men. One drink equals 12 oz of beer, 5 oz of wine, or 1 oz of hard liquor.  Work with your health care provider to maintain a healthy body weight or to lose weight. Ask what an ideal weight is for you.  Get at least 30 minutes of exercise that causes your heart to beat faster (aerobic exercise) most days of the week. Activities may include walking, swimming, or biking.  Work with your health care provider or diet and nutrition specialist (dietitian) to adjust your eating plan to your individual calorie needs. Reading food labels  Check food labels for the amount of sodium per serving. Choose foods with less than 5 percent of the Daily Value of sodium. Generally, foods with less than 300 mg of sodium per serving fit into this eating plan.  To find whole grains, look for the word "whole" as the first word in the ingredient list. Shopping  Buy products labeled as "low-sodium" or "no salt added."  Buy fresh foods. Avoid canned foods and premade or frozen meals. Cooking  Avoid adding salt when cooking. Use salt-free seasonings or herbs instead of table salt or sea salt. Check with your health care provider or pharmacist before using salt substitutes.  Do not fry foods. Cook foods using healthy methods such as baking, boiling, grilling, and broiling instead.  Cook with  heart-healthy oils, such as olive, canola, soybean, or sunflower oil. Meal planning   Eat a balanced diet that includes: ? 5 or more servings of fruits and vegetables each day. At each meal, try to fill half of your plate with fruits and vegetables. ? Up to 6-8 servings of whole grains each day. ? Less than 6 oz of lean meat, poultry, or fish each day. A 3-oz serving of meat is about the same size as a deck of cards. One egg equals 1 oz. ? 2 servings of low-fat dairy each day. ? A serving of nuts, seeds, or beans 5 times each week. ? Heart-healthy fats. Healthy fats called Omega-3 fatty acids are found in foods such as flaxseeds and coldwater fish, like sardines, salmon, and mackerel.  Limit how much you eat of the following: ? Canned or prepackaged foods. ? Food that is high in trans fat, such as fried foods. ? Food that is high in saturated fat, such as fatty meat. ? Sweets, desserts, sugary drinks, and other foods with added sugar. ? Full-fat dairy products.  Do not salt foods before eating.  Try to eat at least 2 vegetarian meals each week.  Eat more home-cooked food and less restaurant, buffet, and fast food.  When eating at a restaurant, ask that your food be prepared with less salt or no salt, if possible. What foods are recommended? The items listed may not be a complete list. Talk with your dietitian about what   dietary choices are best for you. Grains Whole-grain or whole-wheat bread. Whole-grain or whole-wheat pasta. Brown rice. Oatmeal. Quinoa. Bulgur. Whole-grain and low-sodium cereals. Pita bread. Low-fat, low-sodium crackers. Whole-wheat flour tortillas. Vegetables Fresh or frozen vegetables (raw, steamed, roasted, or grilled). Low-sodium or reduced-sodium tomato and vegetable juice. Low-sodium or reduced-sodium tomato sauce and tomato paste. Low-sodium or reduced-sodium canned vegetables. Fruits All fresh, dried, or frozen fruit. Canned fruit in natural juice (without  added sugar). Meat and other protein foods Skinless chicken or turkey. Ground chicken or turkey. Pork with fat trimmed off. Fish and seafood. Egg whites. Dried beans, peas, or lentils. Unsalted nuts, nut butters, and seeds. Unsalted canned beans. Lean cuts of beef with fat trimmed off. Low-sodium, lean deli meat. Dairy Low-fat (1%) or fat-free (skim) milk. Fat-free, low-fat, or reduced-fat cheeses. Nonfat, low-sodium ricotta or cottage cheese. Low-fat or nonfat yogurt. Low-fat, low-sodium cheese. Fats and oils Soft margarine without trans fats. Vegetable oil. Low-fat, reduced-fat, or light mayonnaise and salad dressings (reduced-sodium). Canola, safflower, olive, soybean, and sunflower oils. Avocado. Seasoning and other foods Herbs. Spices. Seasoning mixes without salt. Unsalted popcorn and pretzels. Fat-free sweets. What foods are not recommended? The items listed may not be a complete list. Talk with your dietitian about what dietary choices are best for you. Grains Baked goods made with fat, such as croissants, muffins, or some breads. Dry pasta or rice meal packs. Vegetables Creamed or fried vegetables. Vegetables in a cheese sauce. Regular canned vegetables (not low-sodium or reduced-sodium). Regular canned tomato sauce and paste (not low-sodium or reduced-sodium). Regular tomato and vegetable juice (not low-sodium or reduced-sodium). Pickles. Olives. Fruits Canned fruit in a light or heavy syrup. Fried fruit. Fruit in cream or butter sauce. Meat and other protein foods Fatty cuts of meat. Ribs. Fried meat. Bacon. Sausage. Bologna and other processed lunch meats. Salami. Fatback. Hotdogs. Bratwurst. Salted nuts and seeds. Canned beans with added salt. Canned or smoked fish. Whole eggs or egg yolks. Chicken or turkey with skin. Dairy Whole or 2% milk, cream, and half-and-half. Whole or full-fat cream cheese. Whole-fat or sweetened yogurt. Full-fat cheese. Nondairy creamers. Whipped toppings.  Processed cheese and cheese spreads. Fats and oils Butter. Stick margarine. Lard. Shortening. Ghee. Bacon fat. Tropical oils, such as coconut, palm kernel, or palm oil. Seasoning and other foods Salted popcorn and pretzels. Onion salt, garlic salt, seasoned salt, table salt, and sea salt. Worcestershire sauce. Tartar sauce. Barbecue sauce. Teriyaki sauce. Soy sauce, including reduced-sodium. Steak sauce. Canned and packaged gravies. Fish sauce. Oyster sauce. Cocktail sauce. Horseradish that you find on the shelf. Ketchup. Mustard. Meat flavorings and tenderizers. Bouillon cubes. Hot sauce and Tabasco sauce. Premade or packaged marinades. Premade or packaged taco seasonings. Relishes. Regular salad dressings. Where to find more information:  National Heart, Lung, and Blood Institute: www.nhlbi.nih.gov  American Heart Association: www.heart.org Summary  The DASH eating plan is a healthy eating plan that has been shown to reduce high blood pressure (hypertension). It may also reduce your risk for type 2 diabetes, heart disease, and stroke.  With the DASH eating plan, you should limit salt (sodium) intake to 2,300 mg a day. If you have hypertension, you may need to reduce your sodium intake to 1,500 mg a day.  When on the DASH eating plan, aim to eat more fresh fruits and vegetables, whole grains, lean proteins, low-fat dairy, and heart-healthy fats.  Work with your health care provider or diet and nutrition specialist (dietitian) to adjust your eating plan to your individual   calorie needs. This information is not intended to replace advice given to you by your health care provider. Make sure you discuss any questions you have with your health care provider. Document Released: 06/02/2011 Document Revised: 06/06/2016 Document Reviewed: 06/06/2016 Elsevier Interactive Patient Education  2017 [employer] Inc.  

## 2017-04-17 NOTE — Progress Notes (Signed)
Name: Lisa Goodwin   MRN: [id number]    DOB: 1956/01/18   Date:04/17/2017       Progress Note  Subjective  Chief Complaint  Chief Complaint  Patient presents with  . Hypertension    walk in clinics- has been elevated 140/98. No headache, feels hungry "spacey" all the time.     Hypertension  This is a new problem. The current episode started more than 1 month ago. The problem has been waxing and waning since onset. The problem is uncontrolled. Associated symptoms include blurred vision, malaise/fatigue and sweats. Pertinent negatives include no anxiety, chest pain, headaches, neck pain, orthopnea, palpitations, peripheral edema, PND or shortness of breath. There are no associated agents to hypertension. There are no known risk factors for coronary artery disease. Past treatments include nothing. The current treatment provides moderate improvement. There are no compliance problems.  There is no history of angina, kidney disease, CAD/MI, CVA, heart failure, left ventricular hypertrophy, PVD or retinopathy. There is no history of chronic renal disease or a hypertension causing med.  Dizziness  This is a recurrent (light head/motion in bed) problem. The current episode started more than 1 month ago. The problem occurs daily. The problem has been waxing and waning. Associated symptoms include coughing and fatigue. Pertinent negatives include no abdominal pain, chest pain, chills, congestion, fever, headaches, myalgias, nausea, neck pain, rash, sore throat or visual change. Nothing aggravates the symptoms. She has tried NSAIDs for the symptoms. The treatment provided mild relief.  Cough  This is a recurrent problem. The current episode started more than 1 year ago. The problem has been waxing and waning. Episode frequency: AM. Associated symptoms include sweats. Pertinent negatives include no chest pain, chills, ear pain, fever, headaches, heartburn, myalgias, rash, sore throat, shortness of breath,  weight loss or wheezing. She has tried nothing for the symptoms. There is no history of environmental allergies.    No problem-specific Assessment & Plan notes found for this encounter.   No past medical history on file.  Past Surgical History:  Procedure Laterality Date  . FOOT SURGERY      Family History  Problem Relation Age of Onset  . Cancer Mother   . Hypertension Brother     Social History   Social History  . Marital status: Single    Spouse name: N/A  . Number of children: N/A  . Years of education: N/A   Occupational History  . Not on file.   Social History Main Topics  . Smoking status: Never Smoker  . Smokeless tobacco: Never Used  . Alcohol use No  . Drug use: Unknown  . Sexual activity: Yes   Other Topics Concern  . Not on file   Social History Narrative  . No narrative on file    No Known Allergies  Outpatient Medications Prior to Visit  Medication Sig Dispense Refill  . ciprofloxacin (CIPRO) 250 MG tablet Take 1 tablet (250 mg total) by mouth 2 (two) times daily. 14 tablet 0   No facility-administered medications prior to visit.     Review of Systems  Constitutional: Positive for fatigue and malaise/fatigue. Negative for chills, fever and weight loss.  HENT: Negative for congestion, ear discharge, ear pain and sore throat.   Eyes: Positive for blurred vision.  Respiratory: Positive for cough. Negative for sputum production, shortness of breath and wheezing.   Cardiovascular: Negative for chest pain, palpitations, orthopnea, claudication, leg swelling and PND.  Gastrointestinal: Negative for abdominal pain, blood in  stool, constipation, diarrhea, heartburn, melena and nausea.  Genitourinary: Negative for dysuria, frequency, hematuria and urgency.  Musculoskeletal: Negative for back pain, joint pain, myalgias and neck pain.  Skin: Negative for rash.  Neurological: Positive for dizziness. Negative for tingling, sensory change, focal weakness  and headaches.  Endo/Heme/Allergies: Negative for environmental allergies and polydipsia. Does not bruise/bleed easily.       Polyphagia  Psychiatric/Behavioral: Negative for depression and suicidal ideas. The patient is not nervous/anxious and does not have insomnia.      Objective  Vitals:   04/17/17 0918  BP: (!) 152/84  Pulse: 60  Weight: 106 lb (48.1 kg)  Height: 5\' 5"  (1.651 m)    Physical Exam  Constitutional: She is well-developed, well-nourished, and in no distress. No distress.  HENT:  Head: Normocephalic and atraumatic.  Right Ear: External ear normal.  Left Ear: External ear normal.  Nose: Nose normal.  Mouth/Throat: Oropharynx is clear and moist.  Eyes: Pupils are equal, round, and reactive to light. Conjunctivae and EOM are normal. Right eye exhibits no discharge. Left eye exhibits no discharge.  Neck: Normal range of motion. Neck supple. Normal carotid pulses, no hepatojugular reflux and no JVD present. Carotid bruit is not present. No thyromegaly present.  Cardiovascular: Normal rate, regular rhythm, normal heart sounds and intact distal pulses.  Exam reveals no gallop and no friction rub.   No murmur heard. Pulmonary/Chest: Effort normal and breath sounds normal. She has no wheezes. She has no rales.  Abdominal: Soft. Bowel sounds are normal. She exhibits no mass. There is no tenderness. There is no guarding.  Musculoskeletal: Normal range of motion. She exhibits no edema.  Lymphadenopathy:    She has no cervical adenopathy.  Neurological: She is alert. She has normal reflexes.  Skin: Skin is warm and dry. She is not diaphoretic.  Psychiatric: Mood and affect normal.      Assessment & Plan  Problem List Items Addressed This Visit    None    Visit Diagnoses    Episodic lightheadedness    -  Primary   Relevant Orders   POCT CBG (Fasting - Glucose) (Completed)   Elevated blood pressure reading in office without diagnosis of hypertension       Relevant  Orders   Renal Function Panel   Fatigue, unspecified type       Relevant Orders   CBC   Renal Function Panel   TSH   Dehydration          No orders of the defined types were placed in this encounter.     Dr. Hayden [facility]  04/17/17

## 2017-04-18 LAB — RENAL FUNCTION PANEL
Albumin: 5.3 g/dL — ABNORMAL HIGH (ref 3.6–4.8)
BUN / CREAT RATIO: 18 (ref 12–28)
BUN: 14 mg/dL (ref 8–27)
CHLORIDE: 101 mmol/L (ref 96–106)
CO2: 23 mmol/L (ref 20–29)
Calcium: 10.5 mg/dL — ABNORMAL HIGH (ref 8.7–10.3)
Creatinine, Ser: 0.77 mg/dL (ref 0.57–1.00)
GFR calc Af Amer: 96 mL/min/{1.73_m2} (ref >59)
GFR calc non Af Amer: 84 mL/min/{1.73_m2} (ref >59)
Glucose: 83 mg/dL (ref 65–99)
PHOSPHORUS: 3.5 mg/dL (ref 2.5–4.5)
POTASSIUM: 5.3 mmol/L — AB (ref 3.5–5.2)
SODIUM: 143 mmol/L (ref 134–144)

## 2017-04-18 LAB — CBC
Hematocrit: 48 % — ABNORMAL HIGH (ref 34.0–46.6)
Hemoglobin: 16.1 g/dL — ABNORMAL HIGH (ref 11.1–15.9)
MCH: 33.2 pg — ABNORMAL HIGH (ref 26.6–33.0)
MCHC: 33.5 g/dL (ref 31.5–35.7)
MCV: 99 fL — AB (ref 79–97)
Platelets: 308 10*3/uL (ref 150–379)
RBC: 4.85 x10E6/uL (ref 3.77–5.28)
RDW: 13.4 % (ref 12.3–15.4)
WBC: 9.3 10*3/uL (ref 3.4–10.8)

## 2017-04-18 LAB — TSH: TSH: 1.38 u[IU]/mL (ref 0.450–4.500)

## 2017-05-02 DIAGNOSIS — R05 Cough: Secondary | ICD-10-CM | POA: Diagnosis not present
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
## 2019-01-31 ENCOUNTER — Telehealth: Payer: Self-pay

## 2019-01-31 NOTE — Telephone Encounter (Signed)
Lm to call back

## 2019-01-31 NOTE — Telephone Encounter (Signed)
We have not seen pt in almost 2 years. Please call pt and schedule a CPE for this year to keep pt established with [facility].  Thankyou.

## 2019-02-19 ENCOUNTER — Encounter: Payer: BLUE CROSS/BLUE SHIELD | Admitting: Family Medicine

## 2019-03-05 ENCOUNTER — Other Ambulatory Visit: Payer: Self-pay

## 2019-03-05 ENCOUNTER — Encounter: Payer: Self-pay | Admitting: Family Medicine

## 2019-03-05 ENCOUNTER — Ambulatory Visit (INDEPENDENT_AMBULATORY_CARE_PROVIDER_SITE_OTHER): Payer: 59 | Admitting: Family Medicine

## 2019-03-05 VITALS — BP 138/80 | HR 80 | Ht 65.0 in | Wt 106.0 lb

## 2019-03-05 DIAGNOSIS — Z1211 Encounter for screening for malignant neoplasm of colon: Secondary | ICD-10-CM

## 2019-03-05 DIAGNOSIS — F5101 Primary insomnia: Secondary | ICD-10-CM | POA: Diagnosis not present

## 2019-03-05 NOTE — Progress Notes (Signed)
Date:  03/05/2019   Name:  Lisa Goodwin   DOB:  1956-04-10   MRN:  [id number]   Chief Complaint: Insomnia (gong to try melatonin first)  Insomnia Primary symptoms: fragmented sleep, no sleep disturbance, no difficulty falling asleep, no somnolence, no frequent awakening, no premature morning awakening, malaise/fatigue, no napping.   The current episode started more than one month (about a year). The onset quality is gradual. The problem occurs nightly. The problem has been waxing and waning since onset. The symptoms are aggravated by caffeine and alcohol. Relieved by: no drugs. The treatment provided mild relief.    Review of Systems  Constitutional: Positive for malaise/fatigue. Negative for chills, fatigue, fever and unexpected weight change.  HENT: Negative for congestion, ear discharge, ear pain, rhinorrhea, sinus pressure, sneezing and sore throat.   Eyes: Negative for photophobia, pain, discharge, redness and itching.  Respiratory: Negative for cough, shortness of breath, wheezing and stridor.   Gastrointestinal: Negative for abdominal pain, blood in stool, constipation, diarrhea, nausea and vomiting.  Endocrine: Negative for cold intolerance, heat intolerance, polydipsia, polyphagia and polyuria.  Genitourinary: Negative for dysuria, flank pain, frequency, hematuria, menstrual problem, pelvic pain, urgency, vaginal bleeding and vaginal discharge.  Musculoskeletal: Negative for arthralgias, back pain and myalgias.  Skin: Negative for rash.  Allergic/Immunologic: Negative for environmental allergies and food allergies.  Neurological: Negative for dizziness, weakness, light-headedness, numbness and headaches.  Hematological: Negative for adenopathy. Does not bruise/bleed easily.  Psychiatric/Behavioral: Negative for dysphoric mood and sleep disturbance. The patient has insomnia. The patient is not nervous/anxious.     Patient Active Problem List   Diagnosis Date Noted  . 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 Hammertoe 11/08/2015    No Known Allergies  Past Surgical History:  Procedure Laterality Date  . FOOT SURGERY      Social History   Tobacco Use  . Smoking status: Never Smoker  . Smokeless tobacco: Never Used  Substance Use Topics  . Alcohol use: No  . Drug use: Not on file     Medication list has been reviewed and updated.  No outpatient medications have been marked as taking for the 03/05/19 encounter (Office Visit) with Juline Patch, MD.    [facility] 2/9 Scores 03/05/2019 04/17/2017 09/04/2015  PHQ - 2 Score 0 0 0  PHQ- 9 Score 0 2 -    BP Readings from Last 3 Encounters:  03/05/19 138/80  04/17/17 (!) 152/84  07/28/16 120/80    Physical Exam Vitals signs and nursing note reviewed.  Constitutional:      Appearance: She is well-developed and normal weight.  HENT:     Head: Normocephalic.     Right Ear: Tympanic membrane, ear canal and external ear normal.     Left Ear: Tympanic membrane, ear canal and external ear normal.     Nose: Nose normal. No congestion or rhinorrhea.     Mouth/Throat:     Mouth: Mucous membranes are moist.  Eyes:     General: Lids are everted, no foreign bodies appreciated. No scleral icterus.       Left eye: No foreign body or hordeolum.     Conjunctiva/sclera: Conjunctivae normal.     Right eye: Right conjunctiva is not injected.     Left eye: Left conjunctiva is not injected.     Pupils: Pupils are equal, round, and reactive to light.  Neck:     Musculoskeletal: Normal range of motion and neck supple.  Thyroid: No thyromegaly.     Vascular: No JVD.     Trachea: No tracheal deviation.  Cardiovascular:     Rate and Rhythm: Normal rate and regular rhythm.     Heart sounds: Normal heart sounds. No murmur. No friction rub. No gallop.   Pulmonary:     Effort: Pulmonary effort is normal. No respiratory distress.     Breath sounds: Normal breath sounds. No wheezing, rhonchi or rales.  Abdominal:      General: Bowel sounds are normal.     Palpations: Abdomen is soft. There is no mass.     Tenderness: There is no abdominal tenderness. There is no guarding or rebound.  Musculoskeletal: Normal range of motion.        General: No tenderness.  Lymphadenopathy:     Cervical: No cervical adenopathy.  Skin:    General: Skin is warm.     Capillary Refill: Capillary refill takes less than 2 seconds.     Coloration: Skin is not jaundiced or pale.     Findings: No bruising, erythema, lesion or rash.  Neurological:     Mental Status: She is alert and oriented to person, place, and time.     Cranial Nerves: No cranial nerve deficit.     Deep Tendon Reflexes: Reflexes normal.  Psychiatric:        Mood and Affect: Mood is not anxious or depressed.     Wt Readings from Last 3 Encounters:  03/05/19 106 lb (48.1 kg)  04/17/17 106 lb (48.1 kg)  07/28/16 106 lb (48.1 kg)    BP 138/80   Pulse 80   Ht 5' 5" (1.651 m)   Wt 106 lb (48.1 kg)   BMI 17.64 kg/m   Assessment and Plan:  1. Primary insomnia New onset.  Patient is having early awakening with fragmented sleep.  We will initiate melatonin at 5 to 10 mg nightly.  Patient was also cautioned on caffeine intake.  2. Colon cancer screening Patient will agree to fecal occult immunochemical testing per [facility] and patient was given kit with instructions. - Fecal occult blood, imunochemical

## 2019-03-05 NOTE — Patient Instructions (Addendum)
Melatonin oral solid dosage forms What is this medicine? MELATONIN (mel uh TOH nin) is a dietary supplement. It is mostly promoted to help maintain normal sleep patterns. The FDA has not approved this supplement for any medical use. This supplement may be used for other purposes; ask your health care provider or pharmacist if you have questions. This medicine may be used for other purposes; ask your health care provider or pharmacist if you have questions. COMMON BRAND NAME(S): Melatonex What should I tell my health care provider before I take this medicine? They need to know if you have any of these conditions:  cancer  depression or mental illness  diabetes  hormone problems  if you often drink alcohol  immune system problems  liver disease  lung or breathing disease, like asthma  organ transplant  seizure disorder  an unusual or allergic reaction to melatonin, other medicines, foods, dyes, or preservatives  pregnant or trying to get pregnant  breast-feeding How should I use this medicine? Take this supplement by mouth with a glass of water. Do not take with food. This supplement is usually taken 1 or 2 hours before bedtime. After taking this supplement, limit your activities to those needed to prepare for bed. Some products may be chewed or dissolved in the mouth before swallowing. Some tablets or capsules must be swallowed whole; do not cut, crush or chew. Follow the directions on the package labeling, or take as directed by your health care professional. Do not take this supplement more often than directed. Talk to your pediatrician regarding the use of this supplement in children. Special care may be needed. This supplement is not recommended for use in children without a prescription. Overdosage: If you think you have taken too much of this medicine contact a poison control center or emergency room at once. NOTE: This medicine is only for you. Do not share this medicine  with others. What if I miss a dose? If you miss taking your dose at the usual time, skip that dose. If it is almost time for your next dose, take only that dose. Do not take double or extra doses. What may interact with this medicine? Do not take this medicine with any of the following medications:  fluvoxamine  ramelteon  tasimelteon This medicine may also interact with the following medications:  alcohol  caffeine  carbamazepine  certain antibiotics like ciprofloxacin  certain medicines for depression, anxiety, or psychotic disturbances  cimetidine  female hormones, like estrogens and birth control pills, patches, rings, or injections  methoxsalen  nifedipine  other medications for sleep  other herbal or dietary supplements  phenobarbital  rifampin  smoking tobacco  tamoxifen  warfarin This list may not describe all possible interactions. Give your health care provider a list of all the medicines, herbs, non-prescription drugs, or dietary supplements you use. Also tell them if you smoke, drink alcohol, or use illegal drugs. Some items may interact with your medicine. What should I watch for while using this medicine? See your doctor if your symptoms do not get better or if they get worse. Do not take this supplement for more than 2 weeks unless your doctor tells you to. You may get drowsy or dizzy. Do not drive, use machinery, or do anything that needs mental alertness until you know how this medicine affects you. Do not stand or sit up quickly, especially if you are an older patient. This reduces the risk of dizzy or fainting spells. Alcohol may interfere with  the effect of this medicine. Avoid alcoholic drinks. After taking this medicine, you may get up out of bed and do an activity that you do not know you are doing. The next morning, you may have no memory of this. Activities include driving a car ("sleep-driving"), making and eating food, talking on the phone,  sexual activity, and sleep-walking. Serious injuries have occurred. Call your doctor right away if you find out you have done any of these activities. Do not take this medicine if you have used alcohol that evening. Do not take it if you have taken another medicine for sleep. The risk of doing these sleep-related activities is higher. Talk to your doctor before you use this supplement if you are currently being treated for an emotional, mental, or sleep problem. This medicine may interfere with your treatment. Herbal or dietary supplements are not regulated like medicines. Rigid quality control standards are not required for dietary supplements. The purity and strength of these products can vary. The safety and effect of this dietary supplement for a certain disease or illness is not well known. This product is not intended to diagnose, treat, cure or prevent any disease. The Food and Drug Administration suggests the following to help consumers protect themselves:  Always read product labels and follow directions.  Natural does not mean a product is safe for humans to take.  Look for products that include USP after the ingredient name. This means that the manufacturer followed the standards of the [place] Pharmacopoeia.  Supplements made or sold by a nationally known food or drug company are more likely to be made under tight controls. You can write to the company for more information about how the product was made. What side effects may I notice from receiving this medicine? Side effects that you should report to your doctor or health care professional as soon as possible:  allergic reactions like skin rash, itching or hives, swelling of the face, lips, or tongue  breathing problems  confusion  depressed mood, irritable, or other changes in moods or behaviors  feeling faint or lightheaded, falls  increased blood pressure  irregular or missed menstrual periods  signs and symptoms of liver  injury like dark yellow or brown urine; general ill feeling or flu-like symptoms; light-colored stools; loss of appetite; nausea; right upper belly pain; unusually weak or tired; yellowing of the eyes or skin  trouble staying awake or alert during the day  unusual activities while you are still asleep like driving, eating, making phone calls  unusual bleeding or bruising Side effects that usually do not require medical attention (report to your doctor or health care professional if they continue or are bothersome):  dizziness  drowsiness  headache  hot flashes  nausea  tiredness  unusual dreams or nightmares  upset stomach This list may not describe all possible side effects. Call your doctor for medical advice about side effects. You may report side effects to FDA at 1-800-FDA-1088. Where should I keep my medicine? Keep out of the reach of children. Store at room temperature or as directed on the package label. Protect from moisture. Throw away any unused supplement after the expiration date. NOTE: This sheet is a summary. It may not cover all possible information. If you have questions about this medicine, talk to your doctor, pharmacist, or health care provider.  2020 Elsevier/Gold Standard (2017-12-08 12:11:58)  Mediterranean Diet A Mediterranean diet refers to food and lifestyle choices that are based on the traditions of countries  located on the [place]. This way of eating has been shown to help prevent certain conditions and improve outcomes for people who have chronic diseases, like kidney disease and heart disease. What are tips for following this plan? Lifestyle  Cook and eat meals together with your family, when possible.  Drink enough fluid to keep your urine clear or pale yellow.  Be physically active every day. This includes: ? Aerobic exercise like running or swimming. ? Leisure activities like gardening, walking, or housework.  Get 7-8 hours of  sleep each night.  If recommended by your health care provider, drink red wine in moderation. This means 1 glass a day for nonpregnant women and 2 glasses a day for men. A glass of wine equals 5 oz (150 mL). Reading food labels   Check the serving size of packaged foods. For foods such as rice and pasta, the serving size refers to the amount of cooked product, not dry.  Check the total fat in packaged foods. Avoid foods that have saturated fat or trans fats.  Check the ingredients list for added sugars, such as corn syrup. Shopping  At the grocery store, buy most of your food from the areas near the walls of the store. This includes: ? Fresh fruits and vegetables (produce). ? Grains, beans, nuts, and seeds. Some of these may be available in unpackaged forms or large amounts (in bulk). ? Fresh seafood. ? Poultry and eggs. ? Low-fat dairy products.  Buy whole ingredients instead of prepackaged foods.  Buy fresh fruits and vegetables in-season from local farmers markets.  Buy frozen fruits and vegetables in resealable bags.  If you do not have access to quality fresh seafood, buy precooked frozen shrimp or canned fish, such as tuna, salmon, or sardines.  Buy small amounts of raw or cooked vegetables, salads, or olives from the deli or salad bar at your store.  Stock your pantry so you always have certain foods on hand, such as olive oil, canned tuna, canned tomatoes, rice, pasta, and beans. Cooking  Cook foods with extra-virgin olive oil instead of using butter or other vegetable oils.  Have meat as a side dish, and have vegetables or grains as your main dish. This means having meat in small portions or adding small amounts of meat to foods like pasta or stew.  Use beans or vegetables instead of meat in common dishes like chili or lasagna.  Experiment with different cooking methods. Try roasting or broiling vegetables instead of steaming or sauteing them.  Add frozen vegetables  to soups, stews, pasta, or rice.  Add nuts or seeds for added healthy fat at each meal. You can add these to yogurt, salads, or vegetable dishes.  Marinate fish or vegetables using olive oil, lemon juice, garlic, and fresh herbs. Meal planning   Plan to eat 1 vegetarian meal one day each week. Try to work up to 2 vegetarian meals, if possible.  Eat seafood 2 or more times a week.  Have healthy snacks readily available, such as: ? Vegetable sticks with hummus. ? [place] yogurt. ? Fruit and nut trail mix.  Eat balanced meals throughout the week. This includes: ? Fruit: 2-3 servings a day ? Vegetables: 4-5 servings a day ? Low-fat dairy: 2 servings a day ? Fish, poultry, or lean meat: 1 serving a day ? Beans and legumes: 2 or more servings a week ? Nuts and seeds: 1-2 servings a day ? Whole grains: 6-8 servings a day ? Extra-virgin olive  oil: 3-4 servings a day  Limit red meat and sweets to only a few servings a month What are my food choices?  Mediterranean diet ? Recommended  Grains: Whole-grain pasta. Brown rice. Bulgar wheat. Polenta. Couscous. Whole-wheat bread. [place].  Vegetables: Artichokes. Beets. Broccoli. Cabbage. Carrots. Eggplant. Green beans. Chard. Kale. Spinach. Onions. Leeks. Peas. Squash. Tomatoes. Peppers. Radishes.  Fruits: Apples. Apricots. Avocado. Berries. Bananas. Cherries. Dates. Figs. Grapes. Lemons. Melon. Oranges. Peaches. Plums. Pomegranate.  Meats and other protein foods: Beans. Almonds. Sunflower seeds. Pine nuts. Peanuts. [place]. Salmon. Scallops. Shrimp. [place]. Tilapia. Clams. Oysters. Eggs.  Dairy: Low-fat milk. Cheese. Greek yogurt.  Beverages: Water. Red wine. Herbal tea.  Fats and oils: Extra virgin olive oil. Avocado oil. Grape seed oil.  Sweets and desserts: [place] yogurt with honey. Baked apples. Poached pears. Trail mix.  Seasoning and other foods: Basil. Cilantro. Coriander. Cumin. Mint. Parsley. Sage. Rosemary. Tarragon.  Garlic. Oregano. Thyme. Pepper. Balsalmic vinegar. Tahini. Hummus. Tomato sauce. Olives. Mushrooms. ? Limit these  Grains: Prepackaged pasta or rice dishes. Prepackaged cereal with added sugar.  Vegetables: Deep fried potatoes (french fries).  Fruits: Fruit canned in syrup.  Meats and other protein foods: Beef. Pork. Lamb. Poultry with skin. Hot dogs. [place] Salines.  Dairy: Ice cream. Sour cream. Whole milk.  Beverages: Juice. Sugar-sweetened soft drinks. Beer. Liquor and spirits.  Fats and oils: Butter. Canola oil. Vegetable oil. Beef fat (tallow). Lard.  Sweets and desserts: Cookies. Cakes. Pies. Candy.  Seasoning and other foods: Mayonnaise. Premade sauces and marinades. The items listed may not be a complete list. Talk with your dietitian about what dietary choices are right for you. Summary  The Mediterranean diet includes both food and lifestyle choices.  Eat a variety of fresh fruits and vegetables, beans, nuts, seeds, and whole grains.  Limit the amount of red meat and sweets that you eat.  Talk with your health care provider about whether it is safe for you to drink red wine in moderation. This means 1 glass a day for nonpregnant women and 2 glasses a day for men. A glass of wine equals 5 oz (150 mL). This information is not intended to replace advice given to you by your health care provider. Make sure you discuss any questions you have with your health care provider. Document Released: 02/04/2016 Document Revised: 02/11/2016 Document Reviewed: 02/04/2016 Elsevier Patient Education  2020 [employer].

## 2019-07-17 NOTE — Telephone Encounter (Signed)
LM to call back.

## 2020-01-13 ENCOUNTER — Ambulatory Visit (INDEPENDENT_AMBULATORY_CARE_PROVIDER_SITE_OTHER): Payer: 59

## 2020-01-13 ENCOUNTER — Ambulatory Visit
Admission: EM | Admit: 2020-01-13 | Discharge: 2020-01-13 | Disposition: A | Discharge status: Home / Self Care | Payer: 59 | Attending: Family Medicine | Admitting: Family Medicine

## 2020-01-13 ENCOUNTER — Other Ambulatory Visit: Payer: Self-pay

## 2020-01-13 ENCOUNTER — Encounter: Payer: Self-pay | Admitting: Emergency Medicine

## 2020-01-13 DIAGNOSIS — S20211A Contusion of right front wall of thorax, initial encounter: Secondary | ICD-10-CM | POA: Diagnosis not present

## 2020-01-13 DIAGNOSIS — S40021A Contusion of right upper arm, initial encounter: Secondary | ICD-10-CM

## 2020-01-13 NOTE — ED Provider Notes (Signed)
[facility]    CSN: [id number] Arrival date & time: 01/13/20  7341      History   Chief Complaint Chief Complaint  Patient presents with  . Fall  . Muscle Pain    HPI Lisa Goodwin is a 64 y.o. female. who presents with R lateral rib pain after falling yesterday from doing a jump and landed on the dirt. She did not hit her head, denies LOC. Has a bruise on her R upper arm, but denies pain anywhere else but her ribs.     History reviewed. No pertinent past medical history.  Patient Active Problem List   Diagnosis Date Noted  . 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 Hammertoe 11/08/2015    Past Surgical History:  Procedure Laterality Date  . FOOT SURGERY      OB History   No obstetric history on file.      Home Medications    Prior to Admission medications   Not on File    Family History Family History  Problem Relation Age of Onset  . Cancer Mother   . Hypertension Brother     Social History Social History   Tobacco Use  . Smoking status: Never Smoker  . Smokeless tobacco: Never Used  Substance Use Topics  . Alcohol use: No  . Drug use: Never     Allergies   Patient has no known allergies.   Review of Systems Review of Systems  Constitutional: Negative for appetite change and fever.  HENT: Negative for congestion.   Eyes: Negative for visual disturbance.  Respiratory: Negative for cough, chest tightness, shortness of breath and wheezing.        + chest wall tenderness on R  Cardiovascular: Negative for chest pain.  Gastrointestinal: Negative for abdominal pain, nausea and vomiting.  Genitourinary: Negative for difficulty urinating, hematuria and pelvic pain.  Musculoskeletal: Negative for back pain, gait problem, neck pain and neck stiffness.       + for R rib pain  Skin: Positive for color change. Negative for rash.       Has bruise on R upper arm  Neurological: Negative for dizziness and headaches.      Physical Exam Triage Vital Signs ED Triage Vitals  Enc Vitals Group     BP 01/13/20 0837 122/80     Pulse Rate 01/13/20 0837 62     Resp 01/13/20 0837 18     Temp 01/13/20 0837 98.5 F (36.9 C)     Temp Source 01/13/20 0837 Oral     SpO2 01/13/20 0837 98 %     Weight 01/13/20 0836 106 lb (48.1 kg)     Height 01/13/20 0836 5\' 4"  (1.626 m)     Head Circumference --      Peak Flow --      Pain Score 01/13/20 0835 8     Pain Loc --      Pain Edu? --      Excl. in GC? --    No data found.  Updated Vital Signs BP 122/80 (BP Location: Right Arm)   Pulse 62   Temp 98.5 F (36.9 C) (Oral)   Resp 18   Ht 5\' 4"  (1.626 m)   Wt 106 lb (48.1 kg)   SpO2 98%   BMI 18.19 kg/m   Visual Acuity Right Eye Distance:   Left Eye Distance:   Bilateral Distance:    Right Eye Near:   Left Eye Near:  Bilateral Near:     Physical Exam Vitals and nursing note reviewed.  Constitutional:      General: She is not in acute distress.    Comments: She is very thin  HENT:     Head: Atraumatic.     Right Ear: External ear normal.     Left Ear: External ear normal.     Nose: Nose normal.  Eyes:     General: No scleral icterus.    Conjunctiva/sclera: Conjunctivae normal.     Pupils: Pupils are equal, round, and reactive to light.  Cardiovascular:     Rate and Rhythm: Normal rate and regular rhythm.  Pulmonary:     Effort: Pulmonary effort is normal.     Breath sounds: Normal breath sounds. No wheezing or rales.  Abdominal:     General: Abdomen is flat. There is no distension.     Palpations: Abdomen is soft. There is no mass.     Tenderness: There is no abdominal tenderness. There is no guarding or rebound.  Musculoskeletal:        General: Normal range of motion.     Cervical back: Neck supple. No rigidity or tenderness.     Comments: BACK- has muscular tenderness on R mid thoracic region. But C/T/L vertebrae's are not tender.   Skin:    General: Skin is warm and dry.      Findings: Bruising present. No rash.     Comments: R UPPER ARM- Has faint circular bruise of about 3x5 cm on central bicep region, but ROM of R arm is normal.   Neurological:     Mental Status: She is alert and oriented to person, place, and time.     Gait: Gait normal.  Psychiatric:        Mood and Affect: Mood normal.        Thought Content: Thought content normal.        Judgment: Judgment normal.      UC Treatments / Results  Labs (all labs ordered are listed, but only abnormal results are displayed) Labs Reviewed - No data to display  EKG   Radiology DG Ribs Unilateral W/Chest Right  Result Date: 01/13/2020 CLINICAL DATA:  Patient states she was riding her horse yesterday and fell. She is c/o right side rib pain and states she is having trouble taking a deep breath. Pain radiates around area of BB. Hx smoking EXAM: RIGHT RIBS AND CHEST - 3+ VIEW COMPARISON:  None. FINDINGS: No displaced fracture or other bone lesions are seen involving the ribs. There is no evidence of pneumothorax or pleural effusion. The lungs are hyperinflated. Emphysema. Both lungs are clear. Heart size and mediastinal contours are within normal limits. IMPRESSION: 1. No displaced right rib fracture identified.  No pneumothorax. 2. COPD and emphysema. Electronically Signed   By: Emmaline Kluver M.D.   On: 01/13/2020 09:03    Procedures Procedures (including critical care time)  Medications Ordered in UC Medications - No data to display  Initial Impression / Assessment and Plan / UC Course  I have reviewed the triage vital signs and the nursing notes. Pertinent  imaging results that were available during my care of the patient were reviewed by me and considered in my medical decision making (see chart for details). Has R rib, and R upper arm contusion and R thoracic muscle strain. She declined anything stronger for pain. See instructions. Advised to FU PCP about COPD/Emphasyme findings on CXR since she  used to  be a smoker Final Clinical Impressions(s) / UC Diagnoses   Final diagnoses:  Rib contusion, right, initial encounter  Traumatic ecchymosis of right upper arm, initial encounter     Discharge Instructions     You may continue the Ibuprofen 400 mg every 6-8 hours for pain and add Tylenol 500 mg if needed Use ice for 20 minutes 3-4 times a day the 48h.      ED Prescriptions    None     I have reviewed the PDMP during this encounter.   Garey Ham, [place] 01/13/20 [id number]

## 2020-01-13 NOTE — Discharge Instructions (Signed)
You may continue the Ibuprofen 400 mg every 6-8 hours for pain and add Tylenol 500 mg if needed Use ice for 20 minutes 3-4 times a day the 48h.

## 2020-01-13 NOTE — ED Triage Notes (Signed)
Patient states she was riding her horse yesterday and fell. She is c/o right side rib pain and states she is having trouble taking a deep breath.

## 2020-01-15 ENCOUNTER — Telehealth: Payer: Self-pay | Admitting: Family Medicine

## 2020-01-15 MED ORDER — BACLOFEN 10 MG PO TABS: 10.0000 mg | ORAL_TABLET | Freq: Three times a day (TID) | ORAL | 0 refills | Status: DC | PRN

## 2020-01-15 NOTE — Telephone Encounter (Signed)
Rx sent.  Kayton Ripp DO [facility]  

## 2020-01-15 NOTE — Telephone Encounter (Signed)
Patient called requesting a muscle relaxer from her visit on Monday 07/19. She states the provider offered her the medication on Monday and she declined. Please send to [facility] in [place].

## 2020-01-17 DIAGNOSIS — Y9352 Activity, horseback riding: Secondary | ICD-10-CM | POA: Insufficient documentation

## 2020-01-17 DIAGNOSIS — Y999 Unspecified external cause status: Secondary | ICD-10-CM | POA: Diagnosis not present

## 2020-01-17 DIAGNOSIS — Z87891 Personal history of nicotine dependence: Secondary | ICD-10-CM | POA: Diagnosis not present

## 2020-01-17 DIAGNOSIS — W1789XA Other fall from one level to another, initial encounter: Secondary | ICD-10-CM | POA: Diagnosis not present

## 2020-01-17 DIAGNOSIS — Y929 Unspecified place or not applicable: Secondary | ICD-10-CM | POA: Diagnosis not present

## 2020-01-17 DIAGNOSIS — S2241XA Multiple fractures of ribs, right side, initial encounter for closed fracture: Secondary | ICD-10-CM | POA: Diagnosis not present

## 2020-01-17 DIAGNOSIS — S299XXA Unspecified injury of thorax, initial encounter: Secondary | ICD-10-CM | POA: Diagnosis present

## 2020-01-18 ENCOUNTER — Emergency Department: Payer: 59

## 2020-01-18 ENCOUNTER — Other Ambulatory Visit: Payer: Self-pay

## 2020-01-18 ENCOUNTER — Emergency Department
Admission: EM | Admit: 2020-01-18 | Discharge: 2020-01-18 | Disposition: A | Discharge status: Home / Self Care | Payer: 59 | Attending: Emergency Medicine | Admitting: Emergency Medicine

## 2020-01-18 DIAGNOSIS — S2241XA Multiple fractures of ribs, right side, initial encounter for closed fracture: Secondary | ICD-10-CM

## 2020-01-18 MED ORDER — LIDOCAINE 5 % EX PTCH
1.0000 | MEDICATED_PATCH | CUTANEOUS | Status: DC
  Administered 2020-01-18: 1 via TRANSDERMAL
  Filled 2020-01-18: qty 1

## 2020-01-18 MED ORDER — DOCUSATE SODIUM 100 MG PO CAPS: ORAL_CAPSULE | ORAL | 0 refills | Status: DC

## 2020-01-18 MED ORDER — OXYCODONE-ACETAMINOPHEN 5-325 MG PO TABS
2.0000 | ORAL_TABLET | Freq: Once | ORAL | Status: AC
  Administered 2020-01-18: 2 via ORAL
  Filled 2020-01-18: qty 2

## 2020-01-18 MED ORDER — OXYCODONE-ACETAMINOPHEN 5-325 MG PO TABS: 2.0000 | ORAL_TABLET | Freq: Four times a day (QID) | ORAL | 0 refills | Status: DC | PRN

## 2020-01-18 NOTE — ED Triage Notes (Signed)
Pt to the er for pain to the right flank rib pain. Pt fell off a horse 5 days ago and was [facility]. Pt has been taking IBU and baclofen but pain is worse.

## 2020-01-18 NOTE — ED Provider Notes (Signed)
[facility] Emergency Department Provider Note  ____________________________________________   First MD Initiated Contact with Patient 01/18/20 [phone]     (approximate)  I have reviewed the triage vital signs and the nursing notes.   HISTORY  Chief Complaint Chest Pain    HPI Lisa Goodwin is a 64 y.o. female who is generally fairly healthy other than what she describes as "maybe mild COPD" and ongoing tobacco use.  She presents for persistent and somewhat worsening pain in her right posterior ribs.  She fell off a horse 5 or 6 days ago.  She went to urgent care the following day and her x-ray is normal.  She declined any strong pain medicine but has taken a muscle relaxer as well as over-the-counter medicine.  In spite of this she continues to have severe pain when she takes deep breaths, coughs, or even moves around.  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  She has had no swelling and no bruising on her ribs.         History reviewed. No pertinent past medical history.  Patient Active Problem List   Diagnosis Date Noted  . 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 Hammertoe 11/08/2015    Past Surgical History:  Procedure Laterality Date  . FOOT SURGERY      Prior to Admission medications   Medication Sig Start Date End Date Taking? Authorizing Provider  baclofen (LIORESAL) 10 MG tablet Take 1 tablet (10 mg total) by mouth 3 (three) times daily as needed for muscle spasms. 01/15/20   Tommie Sams, DO  docusate sodium (COLACE) 100 MG capsule Take 1 tablet once or twice daily as needed for constipation while taking narcotic pain medicine 01/18/20   Loleta Rose, MD  oxyCODONE-acetaminophen (PERCOCET) 5-325 MG tablet Take 2 tablets by mouth every 6 (six) hours as needed for severe pain. 01/18/20   Loleta Rose, MD    Allergies Patient has no known allergies.  Family History  Problem Relation Age of Onset  . Cancer Mother   . Hypertension Brother     Social History Social History   Tobacco Use  . Smoking status: Former Games developer  . Smokeless tobacco: Never Used  Substance Use Topics  . Alcohol use: No  . Drug use: Never    Review of Systems Constitutional: No fever/chills Cardiovascular: Chest wall pain on the right posterior side of her chest. Respiratory: Denies shortness of breath. Gastrointestinal: No abdominal pain.  No nausea, no vomiting.   Musculoskeletal: Right posterior chest wall pain after a fall about 5 or 6 days ago. Integumentary: Negative for rash. Neurological: Negative for headaches, focal weakness or numbness.   ____________________________________________   PHYSICAL EXAM:  VITAL SIGNS: ED Triage Vitals  Enc Vitals Group     BP 01/18/20 0018 (!) 171/93     Pulse Rate 01/18/20 0018 65     Resp 01/18/20 0018 17     Temp 01/18/20 0018 98.9 F (37.2 C)     Temp Source 01/18/20 0018 Oral     SpO2 01/18/20 0018 98 %     Weight 01/18/20 0018 48.1 kg (106 lb)     Height 01/18/20 0018 1.626 m (5\' 4" )     Head Circumference --      Peak Flow --  Pain Score 01/18/20 0026 10     Pain Loc --      Pain Edu? --      Excl. in GC? --     Constitutional: Alert and oriented.  Eyes: Conjunctivae are normal.  Head: Atraumatic. Cardiovascular: Normal rate, regular rhythm. Good peripheral circulation. Grossly normal heart sounds. Respiratory: Normal respiratory effort.  No retractions. Musculoskeletal: Tenderness to palpation of the right posterior and lateral ribs.  No gross deformities or palpable abnormalities. Neurologic:  Normal speech and language. No gross focal neurologic deficits are appreciated.  Skin:  Skin is warm, dry and intact.  No bruising on her torso. Psychiatric: Mood and affect are normal. Speech and behavior are  normal.  ____________________________________________   LABS (all labs ordered are listed, but only abnormal results are displayed)  Labs Reviewed - No data to display ____________________________________________  EKG  No indication for emergent EKG ____________________________________________  RADIOLOGY I, Loleta Rose, personally viewed and evaluated these images (plain radiographs) as part of my medical decision making, as well as reviewing the written report by the radiologist.  ED MD interpretation: Rib x-rays suggest rib fractures.  Confirmed by CT noncontrast.  Official radiology report(s): DG Ribs Unilateral W/Chest Right  Result Date: 01/18/2020 CLINICAL DATA:  Larey Seat off of horse 5 days ago, right flank and rib pain EXAM: RIGHT RIBS AND CHEST - 3+ VIEW COMPARISON:  01/13/2020 FINDINGS: Frontal and oblique views of the thoracic cage are obtained. Cardiac silhouette is unremarkable. Interval development of small right pleural effusion and likely right basilar atelectasis. There are minimally displaced right posterolateral seventh through eleventh rib fractures, not seen even in retrospect on the previous evaluation. No evidence of pneumothorax. IMPRESSION: 1. Minimally displaced right posterolateral seventh through eleventh rib fractures, not evident on previous evaluation. 2. Small right pleural effusion and right basilar atelectasis. 3. No evidence of pneumothorax. Electronically Signed   By: Sharlet Salina M.D.   On: 01/18/2020 03:14   CT Chest Wo Contrast  Result Date: 01/18/2020 CLINICAL DATA:  Right flank rib pain EXAM: CT CHEST WITHOUT CONTRAST TECHNIQUE: Multidetector CT imaging of the chest was performed following the standard protocol without IV contrast. COMPARISON:  None. FINDINGS: Cardiovascular: No significant vascular findings. Normal heart size. No pericardial effusion. Mediastinum/Nodes: No enlarged mediastinal or axillary lymph nodes. Thyroid gland, trachea, and  esophagus demonstrate no significant findings. Lungs/Pleura: Biapical emphysema.  Small right pleural effusion. Upper Abdomen: No acute abnormality. Musculoskeletal: There are posterior fractures of the right seventh through eleventh ribs. IMPRESSION: 1. Posterior fractures of the right seventh through eleventh ribs with small right pleural effusion. 2. Emphysema (ICD10-J43.9). Electronically Signed   By: Deatra Robinson M.D.   On: 01/18/2020 06:16    ____________________________________________   PROCEDURES   Procedure(s) performed (including Critical Care):  Procedures   ____________________________________________   INITIAL IMPRESSION / MDM / ASSESSMENT AND PLAN / ED COURSE  As part of my medical decision making, I reviewed the following data within the electronic MEDICAL RECORD NUMBER Nursing notes reviewed and incorporated, Old chart reviewed, Radiograph reviewed  and Notes from prior ED visits and reviewed [place] controlled substance database.   Differential diagnosis includes, but is not limited to, rib fractures, pulmonary contusion, cardiac contusion, rib contusion.  There were rib fractures visible on CXR so I obtained a CT chest to make sure there were no other occult injuries.  CT chest confirmed five rib fractures.  Since she is nearly a week out from the accident and is doing  well in spite of her injuries, I feel she is appropriate for discharge and outpatient follow up.  The patient agrees, and does not want to stay in the hospital regardless.  I had my usual and customary rib fracture discussion including pain management, incentive spirometer, etc.  Patient understands and agrees with the plan.   ____________________________________________  FINAL CLINICAL IMPRESSION(S) / ED DIAGNOSES  Final diagnoses:  Closed fracture of multiple ribs of right side, initial encounter     MEDICATIONS GIVEN DURING THIS VISIT:  Medications  lidocaine (LIDODERM) 5 % 1 patch (1  patch Transdermal Patch Applied 01/18/20 0747)  oxyCODONE-acetaminophen (PERCOCET/ROXICET) 5-325 MG per tablet 2 tablet (2 tablets Oral Given 01/18/20 0747)     ED Discharge Orders         Ordered    oxyCODONE-acetaminophen (PERCOCET) 5-325 MG tablet  Every 6 hours PRN     Discontinue  Reprint     01/18/20 0734    docusate sodium (COLACE) 100 MG capsule     Discontinue  Reprint     01/18/20 0734          *Please note:  Lisa Goodwin was evaluated in Emergency Department on 01/18/2020 for the symptoms described in the history of present illness. She was evaluated in the context of the global COVID-19 pandemic, which necessitated consideration that the patient might be at risk for infection with the SARS-CoV-2 virus that causes COVID-19. Institutional protocols and algorithms that pertain to the evaluation of patients at risk for COVID-19 are in a state of rapid change based on information released by regulatory bodies including the CDC and federal and state organizations. These policies and algorithms were followed during the patient's care in the ED.  Some ED evaluations and interventions may be delayed as a result of limited staffing during and after the pandemic.*  Note:  This document was prepared using Dragon voice recognition software and may include unintentional dictation errors.   Loleta Rose, MD 01/18/20 [phone]

## 2020-01-18 NOTE — Discharge Instructions (Addendum)
Your workup today showed that you have a fracture to one or more ribs.  Unfortunately this type of injury hurts but there is no way to fix it immediately; it must heal over time.  Be sure to take plenty of deep breaths so that you get rid of the "bad air" in your lungs.  If you are given a device called an incentive spirometer, please use it as recommended. ° °Unless you have been told by your doctor not to do so, we recommend you take ibuprofen 600 mg 3 times daily with meals for no more than 5 days.  You can also take Tylenol 1000 mg every 6 hours for pain. ° °Take Percocet as prescribed for severe pain. Do not drink alcohol, drive or participate in any other potentially dangerous activities while taking this medication as it may make you sleepy. Do not take this medication with any other sedating medications, either prescription or over-the-counter. If you were prescribed Percocet or Vicodin, do not take these with acetaminophen (Tylenol) as it is already contained within these medications. °  °This medication is an opiate (or narcotic) pain medication and can be habit forming.  Use it as little as possible to achieve adequate pain control.  Do not use or use it with extreme caution if you have a history of opiate abuse or dependence.  If you are on a pain contract with your primary care doctor or a pain specialist, be sure to let them know you were prescribed this medication today from the [facility].  This medication is intended for your use only - do not give any to anyone else and keep it in a secure place where nobody else, especially children, have access to it.  It will also cause or worsen constipation, so you may want to consider taking an over-the-counter stool softener while you are taking this medication. ° °Follow-up at the clinics or with the doctors described in this paperwork.  Return to the emergency department if he develop new or worsening symptoms that concern  you. °

## 2020-01-18 NOTE — ED Notes (Signed)
Pt updated on plan of care. Pt verbalizes understanding.  

## 2020-01-18 NOTE — ED Notes (Signed)
Report to ashley, rn

## 2020-01-21 ENCOUNTER — Ambulatory Visit: Payer: Self-pay

## 2020-01-21 NOTE — Telephone Encounter (Signed)
Patient called and says she has not had a BM in 11 days. She says she fell off her horse on 7/18/2 and has not been as active, not drinking as much water as usual, drinking more juices, eating fruits and green vegetables. She says she went to the ED on 01/18/20 and was given oxycodone for her broke rib pain. She says she was also given colace to take BID with the oxycodone for constipation. She says today she passed about an inch sized stool, not as constipated in consistency. She denies abdominal pain, but says she has abdominal bloating. No rectal pain, no blood in stools, no straining this morning. She asks if she can take more Colace than prescribed to help with the constipation. Appointment scheduled for f/u on 01/31/20, which she says is fine. She just wants to know what else to do for the constipation. She says she will increase her water intake and continue with the fruit and green vegetables. I advised I will send this to Dr. Yetta Barre and someone will call with her recommendation, she verbalized understanding.  Reason for Disposition . Taking new prescription medication  Answer Assessment - Initial Assessment Questions 1. STOOL PATTERN OR FREQUENCY: "How often do you pass bowel movements (BMs)?"  (Normal range: tid to q 3 days)  "When was the last BM passed?"       Normally daily in the morning; last good BM 11 days; this morning passed 1 inch long stool 2. STRAINING: "Do you have to strain to have a BM?"      Not this morning 3. RECTAL PAIN: "Does your rectum hurt when the stool comes out?" If Yes, ask: "Do you have hemorrhoids? How bad is the pain?"  (Scale 1-10; or mild, moderate, severe)     No 4. STOOL COMPOSITION: "Are the stools hard?"      Normal this morning 5. BLOOD ON STOOLS: "Has there been any blood on the toilet tissue or on the surface of the BM?" If Yes, ask: "When was the last time?"      No 6. CHRONIC CONSTIPATION: "Is this a new problem for you?"  If no, ask: "How long have you  had this problem?" (days, weeks, months)      Yes since falling and not being as active on 01/12/20 7. CHANGES IN DIET OR HYDRATION: "Have there been any recent changes in your diet?" "How much fluids are you drinking consuming on a daily basis?"  "How much have you had to drink today?"     Trying to drink more; eating green veg and frits 8. MEDICATIONS: "Have you been taking any new medications?" "Are you taking any narcotic pain medications?" (e.g., Vicoden, Percocet, morphine, dilaudid)     Started oxycodone since ED visit on 01/18/20, not taking them all the time, but as needed, not taking a whole lot 9. LAXATIVES: "Have you been using any stool softeners, laxatives, or enemas?"  If yes, ask "What, how often, and when was the last time?" 10.ACTIVITY:  "How much walking do you do every day? on a daily basis?"  "Has your activity level decreased in the past week?"        Yes, docusate sodium 11. CAUSE: "What do you think is causing the constipation?"        Not being as active, decreased water intake, pain medication 12. OTHER SYMPTOMS: "Do you have any other symptoms?" (e.g., abdominal pain, bloating, fever, vomiting)      Bloating 13. MEDICAL HISTORY: "Do  you have a history of hemorrhoids, rectal fissures, or rectal surgery or rectal abscess?"         No 14. PREGNANCY: "Is there any chance you are pregnant?" "When was your last menstrual period?"       No  Protocols used: CONSTIPATION-A-AH

## 2020-01-22 NOTE — Telephone Encounter (Signed)
Called pt and advised to add miralax to her regimen. Also, suggested her to go to ED if she does not see a significant difference and continues to get bloated. Pt voiced understanding

## 2020-01-31 ENCOUNTER — Encounter: Payer: Self-pay | Admitting: Family Medicine

## 2020-01-31 ENCOUNTER — Ambulatory Visit (INDEPENDENT_AMBULATORY_CARE_PROVIDER_SITE_OTHER): Payer: 59 | Admitting: Family Medicine

## 2020-01-31 ENCOUNTER — Other Ambulatory Visit: Payer: Self-pay

## 2020-01-31 VITALS — BP 156/98 | HR 73 | Ht 64.0 in | Wt 102.0 lb

## 2020-01-31 DIAGNOSIS — S2241XD Multiple fractures of ribs, right side, subsequent encounter for fracture with routine healing: Secondary | ICD-10-CM

## 2020-01-31 MED ORDER — TRAMADOL HCL 50 MG PO TABS: 50.0000 mg | ORAL_TABLET | Freq: Four times a day (QID) | ORAL | 0 refills | Status: AC

## 2020-01-31 MED ORDER — MELOXICAM 7.5 MG PO TABS: 7.5000 mg | ORAL_TABLET | Freq: Two times a day (BID) | ORAL | 1 refills | Status: DC

## 2020-01-31 NOTE — Progress Notes (Signed)
Date:  01/31/2020   Name:  Lisa Goodwin   DOB:  12/16/1955   MRN:  [id number]   Chief Complaint: Follow-up (feels ok today, some days are good some are bad, still in pain mainly at night)  HPI  Lab Results  Component Value Date   CREATININE 0.77 04/17/2017   BUN 14 04/17/2017   NA 143 04/17/2017   K 5.3 (H) 04/17/2017   CL 101 04/17/2017   CO2 23 04/17/2017   No results found for: CHOL, HDL, LDLCALC, LDLDIRECT, TRIG, CHOLHDL Lab Results  Component Value Date   TSH 1.380 04/17/2017   No results found for: HGBA1C Lab Results  Component Value Date   WBC 9.3 04/17/2017   HGB 16.1 (H) 04/17/2017   HCT 48.0 (H) 04/17/2017   MCV 99 (H) 04/17/2017   PLT 308 04/17/2017   Lab Results  Component Value Date   ALT 15 12/24/2014   AST 22 12/24/2014   ALKPHOS 83 12/24/2014   BILITOT 0.3 12/24/2014     Review of Systems  Constitutional: Negative.  Negative for chills, fatigue, fever and unexpected weight change.  HENT: Negative for congestion, ear discharge, ear pain, rhinorrhea, sinus pressure, sneezing and sore throat.   Eyes: Negative for photophobia, pain, discharge, redness and itching.  Respiratory: Negative for cough, shortness of breath, wheezing and stridor.   Cardiovascular: Negative for chest pain and leg swelling.  Gastrointestinal: Negative for abdominal pain, blood in stool, constipation, diarrhea, nausea and vomiting.  Endocrine: Negative for cold intolerance, heat intolerance, polydipsia, polyphagia and polyuria.  Genitourinary: Negative for dysuria, flank pain, frequency, hematuria, menstrual problem, pelvic pain, urgency, vaginal bleeding and vaginal discharge.  Musculoskeletal: Negative for arthralgias, back pain and myalgias.  Skin: Negative for rash.  Allergic/Immunologic: Negative for environmental allergies and food allergies.  Neurological: Negative for dizziness, weakness, light-headedness, numbness and headaches.  Hematological: Negative for  adenopathy. Does not bruise/bleed easily.  Psychiatric/Behavioral: Negative for dysphoric mood. The patient is not nervous/anxious.     Patient Active Problem List   Diagnosis Date Noted  . 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 Hammertoe 11/08/2015    No Known Allergies  Past Surgical History:  Procedure Laterality Date  . FOOT SURGERY      Social History   Tobacco Use  . Smoking status: Former Games developer  . Smokeless tobacco: Never Used  Substance Use Topics  . Alcohol use: No  . Drug use: Never     Medication list has been reviewed and updated.  Current Meds  Medication Sig  . docusate sodium (COLACE) 100 MG capsule Take 1 tablet once or twice daily as needed for constipation while taking narcotic pain medicine  . ibuprofen (ADVIL) 400 MG tablet Take 400 mg by mouth every 6 (six) hours as needed.  Marland Kitchen oxyCODONE-acetaminophen (PERCOCET) 5-325 MG tablet Take 2 tablets by mouth every 6 (six) hours as needed for severe pain.    PHQ 2/9 Scores 01/31/2020 03/05/2019 04/17/2017 09/04/2015  PHQ - 2 Score 0 0 0 0  PHQ- 9 Score 3 0 2 -    GAD 7 : Generalized Anxiety Score 01/31/2020  Nervous, Anxious, on Edge 0  Control/stop worrying 1  Worry too much - different things 1  Trouble relaxing 2  Restless 1  Easily annoyed or irritable 0  Afraid - awful might happen 0  Total GAD 7 Score 5  Anxiety Difficulty Not difficult at all    BP Readings from Last 3 Encounters:  01/31/20 Marland Kitchen)  156/98  01/18/20 (!) 177/86  01/13/20 122/80    Physical Exam Vitals and nursing note reviewed.  Constitutional:      Appearance: She is well-developed.  HENT:     Head: Normocephalic.     Right Ear: Tympanic membrane and external ear normal.     Left Ear: Tympanic membrane and external ear normal.     Nose: Nose normal.     Mouth/Throat:     Mouth: Mucous membranes are moist.  Eyes:     General: Lids are everted, no foreign bodies appreciated. No scleral icterus.       Left eye: No  foreign body or hordeolum.     Conjunctiva/sclera: Conjunctivae normal.     Right eye: Right conjunctiva is not injected.     Left eye: Left conjunctiva is not injected.     Pupils: Pupils are equal, round, and reactive to light.  Neck:     Thyroid: No thyromegaly.     Vascular: No JVD.     Trachea: No tracheal deviation.  Cardiovascular:     Rate and Rhythm: Normal rate and regular rhythm.     Heart sounds: Normal heart sounds. No murmur heard.  No friction rub. No gallop.   Pulmonary:     Effort: Pulmonary effort is normal. No respiratory distress.     Breath sounds: Normal breath sounds. No decreased breath sounds, wheezing, rhonchi or rales.  Chest:     Chest wall: Tenderness present.     Comments: Right lateral Abdominal:     General: Bowel sounds are normal.     Palpations: Abdomen is soft. There is no mass.     Tenderness: There is no abdominal tenderness. There is no guarding or rebound.  Musculoskeletal:        General: No tenderness. Normal range of motion.     Cervical back: Normal range of motion and neck supple.  Lymphadenopathy:     Cervical: No cervical adenopathy.  Skin:    General: Skin is warm.     Findings: No rash.  Neurological:     Mental Status: She is alert and oriented to person, place, and time.     Cranial Nerves: No cranial nerve deficit.     Deep Tendon Reflexes: Reflexes normal.  Psychiatric:        Mood and Affect: Mood is not anxious or depressed.     Wt Readings from Last 3 Encounters:  01/31/20 102 lb (46.3 kg)  01/18/20 106 lb (48.1 kg)  01/13/20 106 lb (48.1 kg)    BP (!) 156/98   Pulse 73   Ht 5\' 4"  (1.626 m)   Wt 102 lb (46.3 kg)   SpO2 98%   BMI 17.51 kg/m   Assessment and Plan:  1. Closed traumatic nondisplaced fracture of multiple ribs of right side with routine healing, subsequent encounter New onset.  Gradually healing.  Patient was unable to tolerate oxycodone.  We will switch to meloxicam 7.5 mg 1 tablet twice a day  and patient has been given a prescription that is signed for tramadol to be taken every 6 hours as needed for pain or only at night as needed. - meloxicam (MOBIC) 7.5 MG tablet; Take 1 tablet (7.5 mg total) by mouth 2 (two) times daily.  Dispense: 30 tablet; Refill: 1 - traMADol (ULTRAM) 50 MG tablet; Take 1 tablet (50 mg total) by mouth 4 (four) times daily for 5 days.  Dispense: 20 tablet; Refill: 0

## 2020-10-26 ENCOUNTER — Ambulatory Visit (INDEPENDENT_AMBULATORY_CARE_PROVIDER_SITE_OTHER): Payer: Medicare HMO | Admitting: Family Medicine

## 2020-10-26 ENCOUNTER — Ambulatory Visit: Payer: 59 | Admitting: Family Medicine

## 2020-10-26 ENCOUNTER — Other Ambulatory Visit: Payer: Self-pay

## 2020-10-26 ENCOUNTER — Encounter: Payer: Self-pay | Admitting: Family Medicine

## 2020-10-26 VITALS — BP 120/80 | HR 64 | Temp 98.0°F | Ht 64.0 in | Wt 100.0 lb

## 2020-10-26 DIAGNOSIS — R059 Cough, unspecified: Secondary | ICD-10-CM | POA: Diagnosis not present

## 2020-10-26 DIAGNOSIS — J0100 Acute maxillary sinusitis, unspecified: Secondary | ICD-10-CM

## 2020-10-26 MED ORDER — AMOXICILLIN-POT CLAVULANATE 875-125 MG PO TABS: 1.0000 | ORAL_TABLET | Freq: Two times a day (BID) | ORAL | 0 refills | Status: DC

## 2020-10-26 MED ORDER — GUAIFENESIN-CODEINE 100-10 MG/5ML PO SYRP: 5.0000 mL | ORAL_SOLUTION | Freq: Four times a day (QID) | ORAL | 0 refills | Status: DC | PRN

## 2020-10-26 NOTE — Progress Notes (Signed)
Date:  10/26/2020   Name:  Lisa Goodwin   DOB:  06/07/1956   MRN:  [id number]   Chief Complaint: Sinusitis (Drainage with cough- white production. Feels better today other than stuffiness. Had "crusty eyes" over weekend. Cough and congestion gets worse at night. )  Sinusitis This is a new problem. The current episode started in the past 7 days. The problem has been gradually improving since onset. There has been no fever. The pain is moderate. Pertinent negatives include no chills, congestion, coughing, diaphoresis, ear pain, headaches, hoarse voice, neck pain, shortness of breath, sinus pressure, sneezing, sore throat or swollen glands. Past treatments include nothing. The treatment provided moderate relief.  Cough This is a chronic problem. The current episode started more than 1 year ago. The problem has been gradually improving. The cough is productive of sputum. Pertinent negatives include no chills, ear congestion, ear pain, eye redness, fever, headaches, hemoptysis, myalgias, nasal congestion, postnasal drip, rash, rhinorrhea, sore throat, shortness of breath or wheezing. There is no history of environmental allergies.    Lab Results  Component Value Date   CREATININE 0.77 04/17/2017   BUN 14 04/17/2017   NA 143 04/17/2017   K 5.3 (H) 04/17/2017   CL 101 04/17/2017   CO2 23 04/17/2017   No results found for: CHOL, HDL, LDLCALC, LDLDIRECT, TRIG, CHOLHDL Lab Results  Component Value Date   TSH 1.380 04/17/2017   No results found for: HGBA1C Lab Results  Component Value Date   WBC 9.3 04/17/2017   HGB 16.1 (H) 04/17/2017   HCT 48.0 (H) 04/17/2017   MCV 99 (H) 04/17/2017   PLT 308 04/17/2017   Lab Results  Component Value Date   ALT 15 12/24/2014   AST 22 12/24/2014   ALKPHOS 83 12/24/2014   BILITOT 0.3 12/24/2014     Review of Systems  Constitutional: Negative.  Negative for chills, diaphoresis, fatigue, fever and unexpected weight change.  HENT: Negative  for congestion, ear discharge, ear pain, hoarse voice, postnasal drip, rhinorrhea, sinus pressure, sneezing and sore throat.   Eyes: Negative for photophobia, pain, discharge, redness and itching.  Respiratory: Negative for cough, hemoptysis, shortness of breath, wheezing and stridor.   Gastrointestinal: Negative for abdominal pain, blood in stool, constipation, diarrhea, nausea and vomiting.  Endocrine: Negative for cold intolerance, heat intolerance, polydipsia, polyphagia and polyuria.  Genitourinary: Negative for dysuria, flank pain, frequency, hematuria, menstrual problem, pelvic pain, urgency, vaginal bleeding and vaginal discharge.  Musculoskeletal: Negative for arthralgias, back pain, myalgias and neck pain.  Skin: Negative for rash.  Allergic/Immunologic: Negative for environmental allergies and food allergies.  Neurological: Negative for dizziness, weakness, light-headedness, numbness and headaches.  Hematological: Negative for adenopathy. Does not bruise/bleed easily.  Psychiatric/Behavioral: Negative for dysphoric mood. The patient is not nervous/anxious.     Patient Active Problem List   Diagnosis Date Noted  . 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 Hammertoe 11/08/2015    No Known Allergies  Past Surgical History:  Procedure Laterality Date  . FOOT SURGERY      Social History   Tobacco Use  . Smoking status: Former Games developer  . Smokeless tobacco: Never Used  Substance Use Topics  . Alcohol use: No  . Drug use: Never     Medication list has been reviewed and updated.  Current Meds  Medication Sig  . docusate sodium (COLACE) 100 MG capsule Take 1 tablet once or twice daily as needed for constipation while taking narcotic pain medicine  PHQ 2/9 Scores 10/26/2020 01/31/2020 03/05/2019 04/17/2017  PHQ - 2 Score 0 0 0 0  PHQ- 9 Score 0 3 0 2    GAD 7 : Generalized Anxiety Score 10/26/2020 01/31/2020  Nervous, Anxious, on Edge 0 0  Control/stop worrying 0 1  Worry  too much - different things 0 1  Trouble relaxing 0 2  Restless 0 1  Easily annoyed or irritable 0 0  Afraid - awful might happen 0 0  Total GAD 7 Score 0 5  Anxiety Difficulty - Not difficult at all    BP Readings from Last 3 Encounters:  10/26/20 120/80  01/31/20 (!) 156/98  01/18/20 (!) 177/86    Physical Exam Vitals and nursing note reviewed.  Constitutional:      Appearance: She is well-developed.  HENT:     Head: Normocephalic.     Right Ear: Tympanic membrane, ear canal and external ear normal.     Left Ear: Tympanic membrane, ear canal and external ear normal.  Eyes:     General: Lids are everted, no foreign bodies appreciated. No scleral icterus.       Left eye: No foreign body or hordeolum.     Conjunctiva/sclera: Conjunctivae normal.     Right eye: Right conjunctiva is not injected.     Left eye: Left conjunctiva is not injected.     Pupils: Pupils are equal, round, and reactive to light.  Neck:     Thyroid: No thyromegaly.     Vascular: No JVD.     Trachea: No tracheal deviation.  Cardiovascular:     Rate and Rhythm: Normal rate and regular rhythm.     Heart sounds: Normal heart sounds. No murmur heard. No friction rub. No gallop.   Pulmonary:     Effort: Pulmonary effort is normal. No respiratory distress.     Breath sounds: Normal breath sounds. No wheezing or rales.  Abdominal:     General: Bowel sounds are normal.     Palpations: Abdomen is soft. There is no mass.     Tenderness: There is no abdominal tenderness. There is no guarding or rebound.  Musculoskeletal:        General: No tenderness. Normal range of motion.     Cervical back: Normal range of motion and neck supple.  Lymphadenopathy:     Cervical: No cervical adenopathy.  Skin:    General: Skin is warm.     Findings: No rash.  Neurological:     Mental Status: She is alert and oriented to person, place, and time.     Cranial Nerves: No cranial nerve deficit.     Deep Tendon Reflexes:  Reflexes normal.  Psychiatric:        Mood and Affect: Mood is not anxious or depressed.     Wt Readings from Last 3 Encounters:  10/26/20 100 lb (45.4 kg)  01/31/20 102 lb (46.3 kg)  01/18/20 106 lb (48.1 kg)    BP 120/80   Pulse 64   Temp 98 F (36.7 C) (Oral)   Ht 5\' 4"  (1.626 m)   Wt 100 lb (45.4 kg)   SpO2 98%   BMI 17.16 kg/m    Assessment and Plan: 1. Acute non-recurrent maxillary sinusitis New onset.  Persistent.  Stable.  Patient continues to have upper respiratory congestion consistent with sinusitis.  Will treat with Augmentin 875 mg twice a day. - amoxicillin-clavulanate (AUGMENTIN) 875-125 MG tablet; Take 1 tablet by mouth 2 (two) times daily.  Dispense: 20 tablet; Refill: 0  2. Cough Patient has a cough that is persistent.  It is more so at night with change in position.  Patient does have a history of emphysema as noted on chest x-ray.  Chest exam is normal on auscultation but we will initiate Mucinex DM during the day with Robitussin-AC at night for control of cough. - guaiFENesin-codeine (ROBITUSSIN AC) 100-10 MG/5ML syrup; Take 5 mLs by mouth 4 (four) times daily as needed for cough.  Dispense: 118 mL; Refill: 0

## 2021-03-13 ENCOUNTER — Telehealth: Payer: Self-pay

## 2021-03-13 NOTE — Telephone Encounter (Signed)
1st attempt to call and advise that she will need an OV for Initial Welcome to Medicare AWV. VM is not currently set up. I will try to call back on a later date.

## 2021-07-12 ENCOUNTER — Other Ambulatory Visit: Payer: Self-pay

## 2021-07-12 ENCOUNTER — Telehealth: Payer: Self-pay

## 2021-07-12 ENCOUNTER — Encounter: Payer: Self-pay | Admitting: Family Medicine

## 2021-07-12 ENCOUNTER — Telehealth (INDEPENDENT_AMBULATORY_CARE_PROVIDER_SITE_OTHER): Payer: Medicare HMO | Admitting: Family Medicine

## 2021-07-12 VITALS — Temp 98.1°F

## 2021-07-12 DIAGNOSIS — U071 COVID-19: Secondary | ICD-10-CM

## 2021-07-12 DIAGNOSIS — J019 Acute sinusitis, unspecified: Secondary | ICD-10-CM

## 2021-07-12 MED ORDER — AZITHROMYCIN 250 MG PO TABS: ORAL_TABLET | ORAL | 0 refills | Status: AC

## 2021-07-12 NOTE — Progress Notes (Signed)
Date:  07/12/2021   Name:  Lisa Goodwin   DOB:  [birth year]   MRN:  [id number]   Chief Complaint: Covid Positive (Started Saturday with fever and body aches. Took test yesterday- was positive. Ear pain- Ibuprofen helps with that)  I Anne Fu, MD from my office connected with this patient, Lisa Goodwin, by telephone at the patient's home.  I verified that I am speaking with the correct person using two identifiers. This visit was conducted via telephone due to the Covid-19 outbreak from my office at [facility] in [place], [place]. I discussed the limitations, risks, security and privacy concerns of performing an evaluation and management service by telephone. I also discussed with the patient that there may be a patient responsible charge related to this service. The patient expressed understanding and agreed to proceed.    Fever  This is a new problem. The current episode started in the past 7 days. The problem occurs intermittently. The problem has been gradually improving. Associated symptoms include congestion, coughing, ear pain and muscle aches. Pertinent negatives include no abdominal pain, diarrhea, headaches or sore throat.  Otalgia  There is pain in both (l>r) ears. The problem has been waxing and waning. Associated symptoms include coughing. Pertinent negatives include no abdominal pain, diarrhea, ear discharge, headaches, hearing loss, neck pain or sore throat.   Lab Results  Component Value Date   NA 143 04/17/2017   K 5.3 (H) 04/17/2017   CO2 23 04/17/2017   GLUCOSE 83 04/17/2017   BUN 14 04/17/2017   CREATININE 0.77 04/17/2017   CALCIUM 10.5 (H) 04/17/2017   GFRNONAA 84 04/17/2017   No results found for: CHOL, HDL, LDLCALC, LDLDIRECT, TRIG, CHOLHDL Lab Results  Component Value Date   TSH 1.380 04/17/2017   No results found for: HGBA1C Lab Results  Component Value Date   WBC 9.3 04/17/2017   HGB 16.1 (H) 04/17/2017   HCT 48.0 (H) 04/17/2017    MCV 99 (H) 04/17/2017   PLT 308 04/17/2017   Lab Results  Component Value Date   ALT 15 12/24/2014   AST 22 12/24/2014   ALKPHOS 83 12/24/2014   BILITOT 0.3 12/24/2014   No results found for: 25OHVITD2, 25OHVITD3, VD25OH   Review of Systems  Constitutional:  Positive for fever.  HENT:  Positive for congestion and ear pain. Negative for ear discharge, hearing loss and sore throat.   Respiratory:  Positive for cough.   Gastrointestinal:  Negative for abdominal pain and diarrhea.  Musculoskeletal:  Negative for neck pain.  Neurological:  Negative for headaches.   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  Current Meds  Medication Sig   docusate sodium (COLACE) 100 MG capsule Take 1 tablet once or twice daily as needed for constipation while taking narcotic pain medicine    PHQ 2/9 Scores 07/12/2021 10/26/2020 01/31/2020 03/05/2019  PHQ - 2 Score 0 0 0 0  PHQ- 9 Score 0 0 3 0    GAD 7 : Generalized Anxiety Score 07/12/2021 10/26/2020 01/31/2020  Nervous, Anxious, on Edge 0 0 0  Control/stop worrying 0 0 1  Worry too much - different things 0 0 1  Trouble relaxing 0 0 2  Restless 0 0 1  Easily annoyed or irritable 0 0 0  Afraid - awful might happen 0 0 0  Total GAD 7 Score 0 0 5  Anxiety Difficulty Not difficult at all - Not difficult at all    BP Readings from Last 3 Encounters:  10/26/20 120/80  01/31/20 (!) 156/98  01/18/20 (!) 177/86    Physical Exam Vitals and nursing note reviewed.    Wt Readings from Last 3 Encounters:  10/26/20 100 lb (45.4 kg)  01/31/20 102 lb (46.3 kg)  01/18/20 106 lb (48.1  kg)    Temp 98.1 F (36.7 C) (Oral)   Assessment and Plan:  1. COVID New onset.  Gradually improving.  Case is relatively mild.  Review of risk score is 1.  We discussed worsening events that would require hospital attention otherwise patient is doing well except for some upper respiratory congestion ear pain and cough.  2. Acute non-recurrent sinusitis, unspecified location Sounds like patient is having some secondary infection with some otalgia as well we will cover with azithromycin 250 mg 2 today followed by 1 a day for 4 days.  I spent 10 minutes with this patient, More than 50% of that time was spent in face to face education, counseling and care coordination.

## 2021-07-12 NOTE — Telephone Encounter (Signed)
I called both numbers on file for pt's visit- had to leave a message

## 2021-07-12 NOTE — Patient Instructions (Signed)

## 2021-07-26 ENCOUNTER — Telehealth: Payer: Self-pay | Admitting: Family Medicine

## 2021-07-26 NOTE — Telephone Encounter (Signed)
Copied from CRM #[id number]. Topic: Medicare AWV °>> Jul 26, 2021 10:46 AM Smith, Stephanie R wrote: °Reason for CRM:  °Left message for patient to call back and schedule Medicare Annual Wellness Visit (AWV) in office.  ° °If unable to come into the office for AWV,  please offer to do virtually or by telephone. ° °No hx of AWV eligible for AWVI as of 05/27/2021 per [facility] ° °Please schedule at anytime with [facility] Health Advisor.     ° °40 Minutes appointment  ° °Any questions, please call me at [phone] °

## 2021-07-26 NOTE — Telephone Encounter (Signed)
Copied from CRM [id number]. Topic: Medicare AWV >> Jul 26, 2021 10:46 AM Claudette Laws R wrote: Reason for CRM:  Left message for patient to call back and schedule Medicare Annual Wellness Visit (AWV) in office.   If unable to come into the office for AWV,  please offer to do virtually or by telephone.  No hx of AWV eligible for AWVI as of 05/27/2021 per [facility]  Please schedule at anytime with [facility] Health Advisor.      40 Minutes appointment   Any questions, please call me at [phone]

## 2021-11-09 ENCOUNTER — Telehealth: Payer: Self-pay | Admitting: Family Medicine

## 2021-11-09 NOTE — Telephone Encounter (Signed)
Copied from CRM [id number]. Topic: Medicare AWV ?>> Nov 09, 2021  1:20 PM Claudette Laws R wrote: ?Reason for CRM:  ?Left message for patient to call back and schedule Medicare Annual Wellness Visit (AWV) in office.  ? ?If unable to come into the office for AWV,  please offer to do virtually or by telephone. ? ?No hx of AWV eligible for AWVI per [facility] as of 05/27/2021 ? ?Please schedule at anytime with [facility] Health Advisor.     ? ?45 minute appointment  ? ?Any questions, please call me at [phone] ?

## 2022-01-13 ENCOUNTER — Telehealth: Payer: Self-pay | Admitting: Family Medicine

## 2022-01-13 NOTE — Telephone Encounter (Signed)
Copied from CRM [id number]. Topic: Medicare AWV >> Jan 13, 2022  1:09 PM Zannie Kehr wrote: Reason for CRM:  Left message for patient to call back and schedule Medicare Annual Wellness Visit (AWV) in office.   If unable to come into the office for AWV,  please offer to do virtually or by telephone.  No hx of AWV eligible for AWVI per [facility] as of 05/27/2021  Please schedule at anytime with [facility] Health Advisor.      45 minute appointment   Any questions, please call me at [phone]

## 2022-02-15 ENCOUNTER — Telehealth: Payer: Self-pay | Admitting: Family Medicine

## 2022-02-15 NOTE — Telephone Encounter (Signed)
Copied from CRM [id number]. Topic: Medicare AWV >> Feb 15, 2022  1:44 PM Zannie Kehr wrote: Reason for CRM:  Left message for patient to call back and schedule Medicare Annual Wellness Visit (AWV) in office.   If unable to come into the office for AWV,  please offer to do virtually or by telephone.  No hx of AWV eligible for AWVI per [facility] as of 10/25/2021  Please schedule at anytime with [facility] Health Advisor.      45 minute appointment   Any questions, please call me at [phone]

## 2022-04-05 ENCOUNTER — Ambulatory Visit (INDEPENDENT_AMBULATORY_CARE_PROVIDER_SITE_OTHER): Payer: Medicare HMO | Admitting: Family Medicine

## 2022-04-05 ENCOUNTER — Encounter: Payer: Self-pay | Admitting: Family Medicine

## 2022-04-05 ENCOUNTER — Ambulatory Visit: Payer: Self-pay | Admitting: *Deleted

## 2022-04-05 VITALS — BP 120/78 | HR 84 | Temp 98.2°F | Ht 64.0 in | Wt 101.0 lb

## 2022-04-05 DIAGNOSIS — J019 Acute sinusitis, unspecified: Secondary | ICD-10-CM | POA: Diagnosis not present

## 2022-04-05 DIAGNOSIS — Z1211 Encounter for screening for malignant neoplasm of colon: Secondary | ICD-10-CM

## 2022-04-05 MED ORDER — AMOXICILLIN-POT CLAVULANATE 875-125 MG PO TABS: 1.0000 | ORAL_TABLET | Freq: Two times a day (BID) | ORAL | 0 refills | Status: DC

## 2022-04-05 NOTE — Progress Notes (Signed)
Date:  04/05/2022   Name:  Lisa Goodwin   DOB:  03/01/1956   MRN:  [id number]   Chief Complaint: Sinusitis (Started yesterday with some cough- yellow production, drainage making her nauseated, eyes were crusty this am) and Colon Cancer Screening (Needs baseline colonoscopy)  Sinusitis This is a new problem. The current episode started in the past 7 days. The problem has been gradually worsening since onset. Associated symptoms include congestion, coughing, headaches, neck pain, shortness of breath, sinus pressure and sneezing. Pertinent negatives include no chills, ear pain or sore throat. The treatment provided mild relief.    Lab Results  Component Value Date   NA 143 04/17/2017   K 5.3 (H) 04/17/2017   CO2 23 04/17/2017   GLUCOSE 83 04/17/2017   BUN 14 04/17/2017   CREATININE 0.77 04/17/2017   CALCIUM 10.5 (H) 04/17/2017   GFRNONAA 84 04/17/2017   No results found for: "CHOL", "HDL", "LDLCALC", "LDLDIRECT", "TRIG", "CHOLHDL" Lab Results  Component Value Date   TSH 1.380 04/17/2017   No results found for: "HGBA1C" Lab Results  Component Value Date   WBC 9.3 04/17/2017   HGB 16.1 (H) 04/17/2017   HCT 48.0 (H) 04/17/2017   MCV 99 (H) 04/17/2017   PLT 308 04/17/2017   Lab Results  Component Value Date   ALT 15 12/24/2014   AST 22 12/24/2014   ALKPHOS 83 12/24/2014   BILITOT 0.3 12/24/2014   No results found for: "25OHVITD2", "25OHVITD3", "VD25OH"   Review of Systems  Constitutional:  Negative for chills and fever.  HENT:  Positive for congestion, sinus pressure and sneezing. Negative for drooling, ear discharge, ear pain and sore throat.   Respiratory:  Positive for cough and shortness of breath. Negative for wheezing.   Cardiovascular:  Negative for chest pain, palpitations and leg swelling.  Gastrointestinal:  Negative for abdominal pain, blood in stool, constipation, diarrhea and nausea.  Endocrine: Negative for polydipsia.  Genitourinary:  Negative for  dysuria, frequency, hematuria and urgency.  Musculoskeletal:  Positive for neck pain. Negative for back pain and myalgias.  Skin:  Negative for rash.  Allergic/Immunologic: Negative for environmental allergies.  Neurological:  Positive for headaches. Negative for dizziness.  Hematological:  Does not bruise/bleed easily.  Psychiatric/Behavioral:  Negative for suicidal ideas. The patient is not nervous/anxious.     Patient Active Problem List   Diagnosis Date Noted  . 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 Hammertoe 11/08/2015    No Known Allergies  Past Surgical History:  Procedure Laterality Date  . FOOT SURGERY      Social History   Tobacco Use  . Smoking status: Former  . Smokeless tobacco: Never  Substance Use Topics  . Alcohol use: No  . Drug use: Never     Medication list has been reviewed and updated.  No outpatient medications have been marked as taking for the 04/05/22 encounter (Office Visit) with Juline Patch, MD.       04/05/2022   11:01 AM 07/12/2021    9:38 AM 10/26/2020    2:11 PM 01/31/2020    3:29 PM  GAD 7 : Generalized Anxiety Score  Nervous, Anxious, on Edge 0 0 0 0  Control/stop worrying 0 0 0 1  Worry too much - different things 0 0 0 1  Trouble relaxing 0 0 0 2  Restless 0 0 0 1  Easily annoyed or irritable 0 0 0 0  Afraid - awful might happen 0 0 0  0  Total GAD 7 Score 0 0 0 5  Anxiety Difficulty Not difficult at all Not difficult at all  Not difficult at all       04/05/2022   11:01 AM 07/12/2021    9:38 AM 10/26/2020    2:11 PM  Depression screen PHQ 2/9  Decreased Interest 0 0 0  Down, Depressed, Hopeless 0 0 0  PHQ - 2 Score 0 0 0  Altered sleeping 0 0 0  Tired, decreased energy 0 0 0  Change in appetite 0 0 0  Feeling bad or failure about yourself  0 0 0  Trouble concentrating 0 0 0  Moving slowly or fidgety/restless 0 0 0  Suicidal thoughts 0 0 0  PHQ-9 Score 0 0 0  Difficult doing work/chores Not difficult at all Not  difficult at all     BP Readings from Last 3 Encounters:  04/05/22 120/78  10/26/20 120/80  01/31/20 (!) 156/98    Physical Exam Vitals and nursing note reviewed. Exam conducted with a chaperone present.  Constitutional:      General: She is not in acute distress.    Appearance: She is not diaphoretic.  HENT:     Head: Normocephalic and atraumatic.     Right Ear: Tympanic membrane and external ear normal.     Left Ear: Tympanic membrane and external ear normal.     Nose: Nose normal.     Mouth/Throat:     Mouth: Mucous membranes are moist.     Pharynx: No oropharyngeal exudate or posterior oropharyngeal erythema.  Eyes:     General:        Right eye: No discharge.        Left eye: No discharge.     Conjunctiva/sclera: Conjunctivae normal.     Pupils: Pupils are equal, round, and reactive to light.  Neck:     Thyroid: No thyromegaly.     Vascular: No JVD.  Cardiovascular:     Rate and Rhythm: Normal rate and regular rhythm.     Heart sounds: Normal heart sounds. No murmur heard.    No friction rub. No gallop.  Pulmonary:     Effort: Pulmonary effort is normal.     Breath sounds: No wheezing, rhonchi or rales.  Abdominal:     General: Bowel sounds are normal.     Palpations: Abdomen is soft. There is no mass.     Tenderness: There is no abdominal tenderness. There is no guarding or rebound.  Musculoskeletal:        General: Normal range of motion.     Cervical back: Normal range of motion and neck supple.  Lymphadenopathy:     Cervical: No cervical adenopathy.  Skin:    General: Skin is warm and dry.  Neurological:     Mental Status: She is alert.    Wt Readings from Last 3 Encounters:  04/05/22 101 lb (45.8 kg)  10/26/20 100 lb (45.4 kg)  01/31/20 102 lb (46.3 kg)    BP 120/78   Pulse 84   Temp 98.2 F (36.8 C) (Oral)   Ht 5\' 4"  (1.626 m)   Wt 101 lb (45.8 kg)   BMI 17.34 kg/m   Assessment and Plan:  1. Acute non-recurrent sinusitis, unspecified  location New onset.  Persistent.  Stable.  However has continued with symptomatology from the weekend.  History and examination is consistent with sinusitis and will treat with Augmentin 875 mg twice a day for 10  days. - amoxicillin-clavulanate (AUGMENTIN) 875-125 MG tablet; Take 1 tablet by mouth 2 (two) times daily.  Dispense: 20 tablet; Refill: 0  2. Colon cancer screening Discussed and administered. - Ambulatory referral to Gastroenterology    Elizabeth Sauer, MD

## 2022-04-05 NOTE — Telephone Encounter (Signed)
Noted  KP 

## 2022-04-05 NOTE — Telephone Encounter (Signed)
Summary: post nasal drainage, dry cough, congestion and sore throat   Pt states she has an ear ache, post nasal drainage, dry cough, congestion and sore throat x2d   Pt requesting a rx for a z pack   Please assist further        Chief Complaint: cold sx. Negative test for covid on 04/04/22 Symptoms: post nasal drip, dry and productive cough at times yellowish green sputum. Sore throat , earache Frequency: 2 days  Pertinent Negatives: Patient denies difficulty breathing no fever Disposition: [] ED /[] Urgent Care (no appt availability in office) / [x] Appointment(In office/virtual)/ []  [facility]/ [] Home Care/ [] Refused Recommended Disposition /[] [facility]/ []  Follow-up with PCP Additional Notes:   Appt scheduled for today . Patient requesting medication z-pack or augmentin. Patient would like to know if she does not need appt please call back before 10:15. Reinforced to patient she needs to keep appt today for medication.      Reason for Disposition  Earache  Answer Assessment - Initial Assessment Questions 1. ONSET: "When did the cough begin?"      2 days ago  2. SEVERITY: "How bad is the cough today?"      Dry most of the time but has been productive 3. SPUTUM: "Describe the color of your sputum" (none, dry cough; clear, white, yellow, green)     Yellowish green 4. HEMOPTYSIS: "Are you coughing up any blood?" If so ask: "How much?" (flecks, streaks, tablespoons, etc.)     no 5. DIFFICULTY BREATHING: "Are you having difficulty breathing?" If Yes, ask: "How bad is it?" (e.g., mild, moderate, severe)    - MILD: No SOB at rest, mild SOB with walking, speaks normally in sentences, can lie down, no retractions, pulse < 100.    - MODERATE: SOB at rest, SOB with minimal exertion and prefers to sit, cannot lie down flat, speaks in phrases, mild retractions, audible wheezing, pulse 100-120.    - SEVERE: Very SOB at rest, speaks in single words, struggling to  breathe, sitting hunched forward, retractions, pulse > 120      no 6. FEVER: "Do you have a fever?" If Yes, ask: "What is your temperature, how was it measured, and when did it start?"     no 7. CARDIAC HISTORY: "Do you have any history of heart disease?" (e.g., heart attack, congestive heart failure)      na 8. LUNG HISTORY: "Do you have any history of lung disease?"  (e.g., pulmonary embolus, asthma, emphysema)     na 9. PE RISK FACTORS: "Do you have a history of blood clots?" (or: recent major surgery, recent prolonged travel, bedridden)     na 10. OTHER SYMPTOMS: "Do you have any other symptoms?" (e.g., runny nose, wheezing, chest pain)       Bilateral earache, sore throat , post nasal drip , cough dry and productive at times. 11. PREGNANCY: "Is there any chance you are pregnant?" "When was your last menstrual period?"       na 12. TRAVEL: "Have you traveled out of the country in the last month?" (e.g., travel history, exposures)       na  Protocols used: Cough - Acute Productive-A-AH

## 2022-04-13 ENCOUNTER — Telehealth: Payer: Self-pay | Admitting: Family Medicine

## 2022-04-13 NOTE — Telephone Encounter (Signed)
Copied from [place] [id number]. Topic: Medicare AWV >> Apr 13, 2022 10:59 AM Jae Dire wrote: Reason for CRM:  Left message for patient to call back and schedule Medicare Annual Wellness Visit (AWV) in office.   If unable to come into the office for AWV,  please offer to do virtually or by telephone.  No hx of AWV eligible for AWVI per [facility] as of 05/27/2021  Please schedule at any time with [facility] -Nurse Health Advisor.      45 minute appointment   Any questions, please call me at [phone]

## 2022-04-20 DIAGNOSIS — Z0100 Encounter for examination of eyes and vision without abnormal findings: Secondary | ICD-10-CM | POA: Diagnosis not present

## 2022-04-20 DIAGNOSIS — H524 Presbyopia: Secondary | ICD-10-CM | POA: Diagnosis not present

## 2022-07-12 IMAGING — CT CT CHEST W/O CM
2 of 3 series · 15 of 36 positions shown, 18 images · non-contrast
Comparison: None.

CLINICAL DATA: Right flank rib pain

EXAM:
CT CHEST WITHOUT CONTRAST
TECHNIQUE: Multidetector CT imaging of the chest was performed following the
standard protocol without IV contrast.

[Series 2: thorax · axial · 0.57mm/px · z∈[-558,-282]mm · 12 of 163 slices shown, 15 images]
[im 13/163  mediastinal]
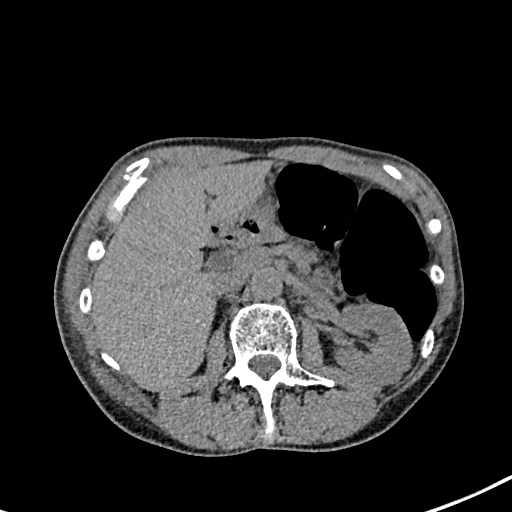
[im 13/163  lung]
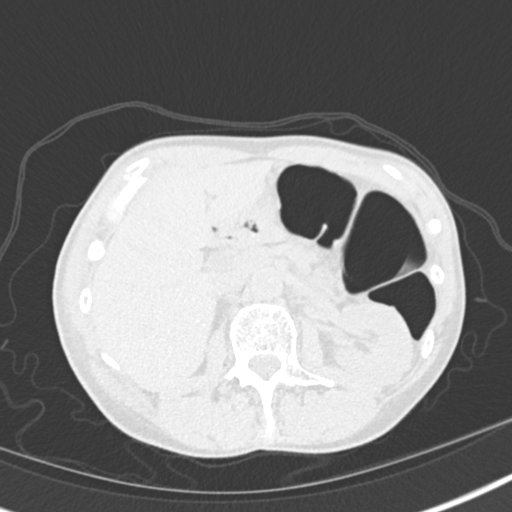
[im 25/163  lung]
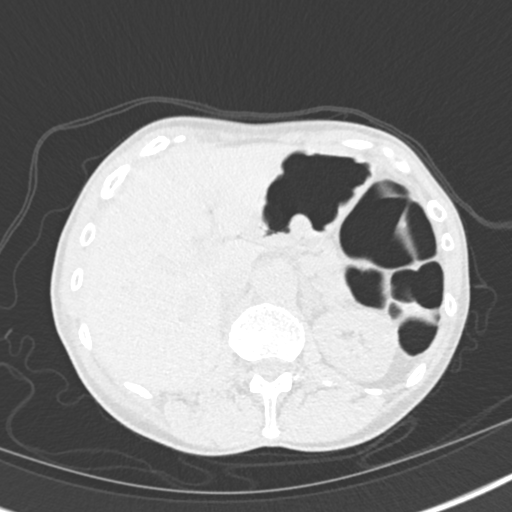
[im 37/163  lung]
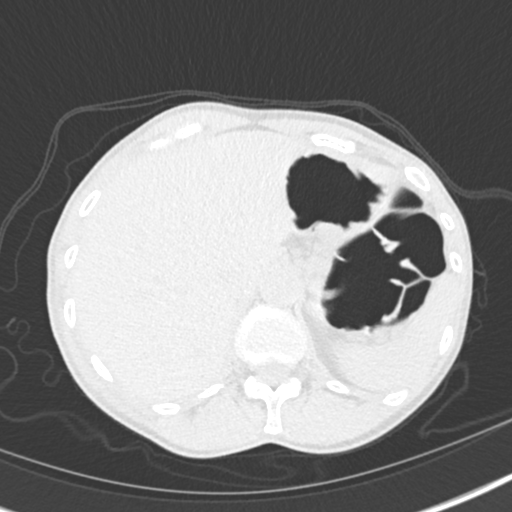
[im 49/163  lung]
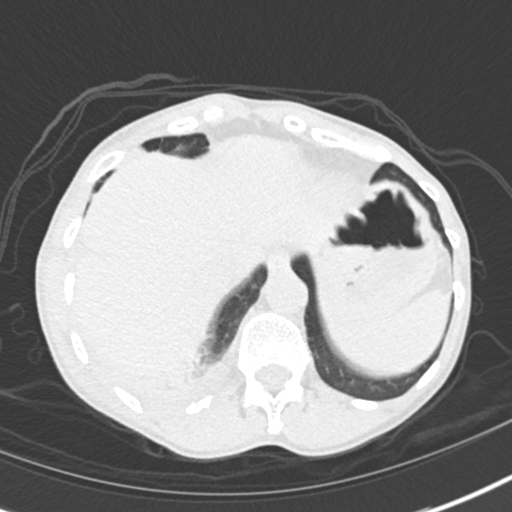
[im 61/163  mediastinal]
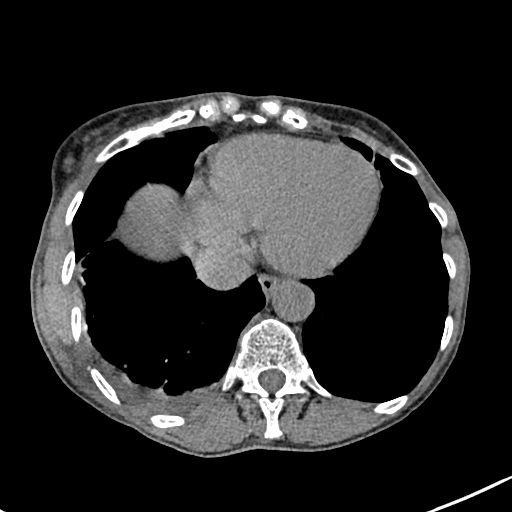
[im 61/163  lung]
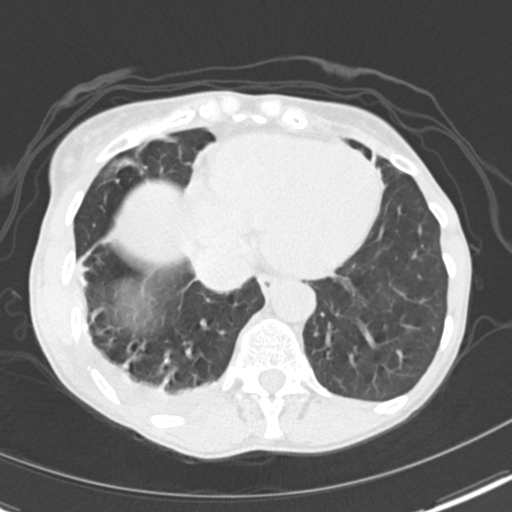
[im 73/163  lung]
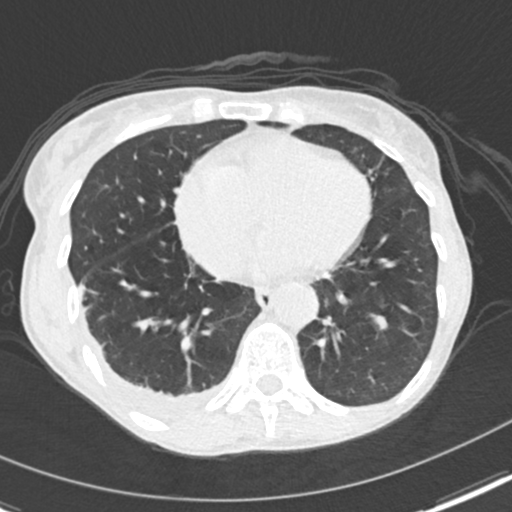
[im 91/163  lung]
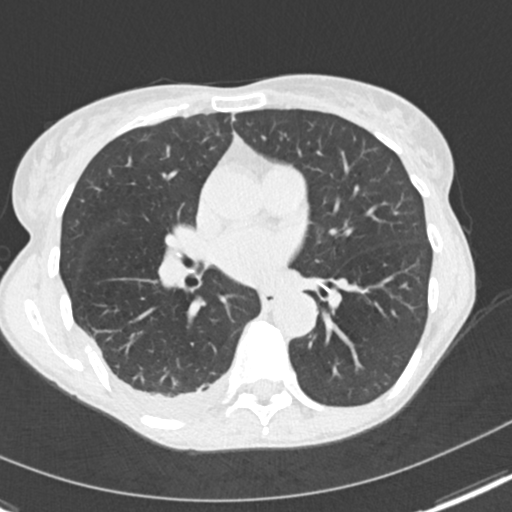
[im 103/163  lung]
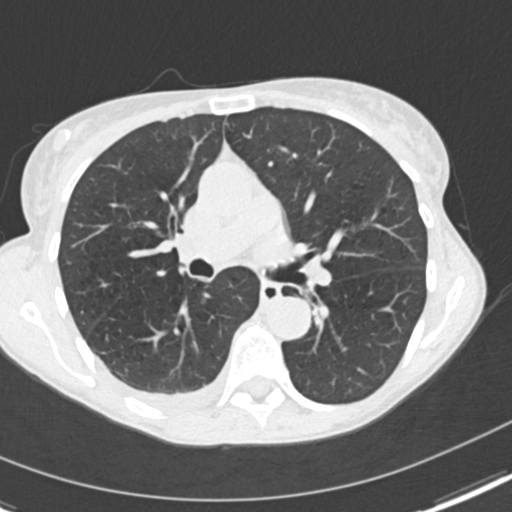
[im 115/163  mediastinal]
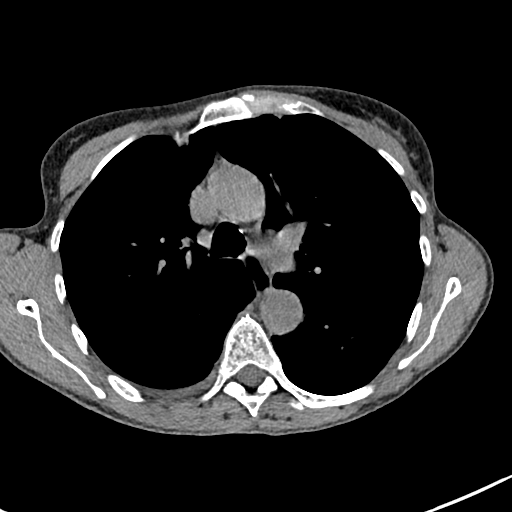
[im 115/163  lung]
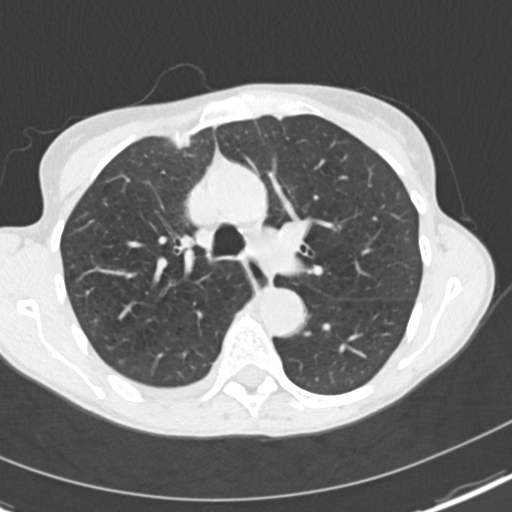
[im 127/163  lung]
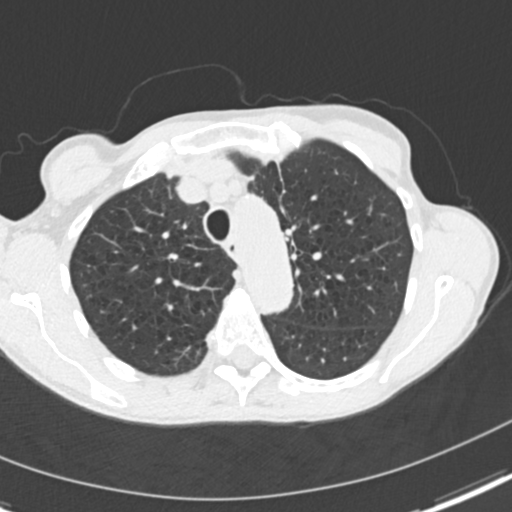
[im 139/163  lung]
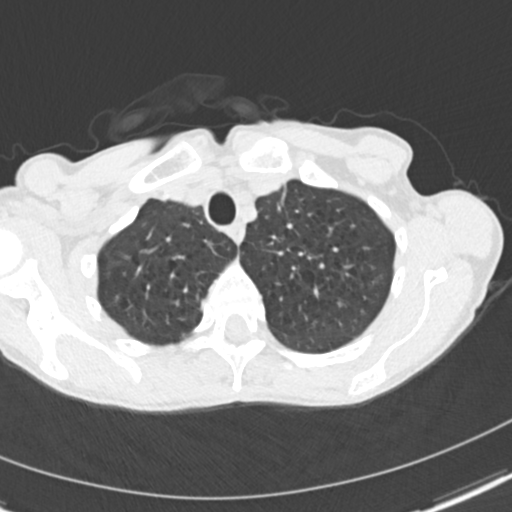
[im 151/163  lung]
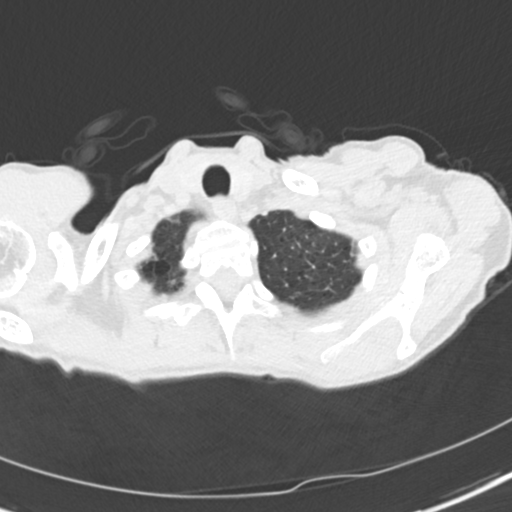

[Series 5: coronal · coronal · 0.61mm/px · 3 of 106 slices shown]
[im 22/106  lung]
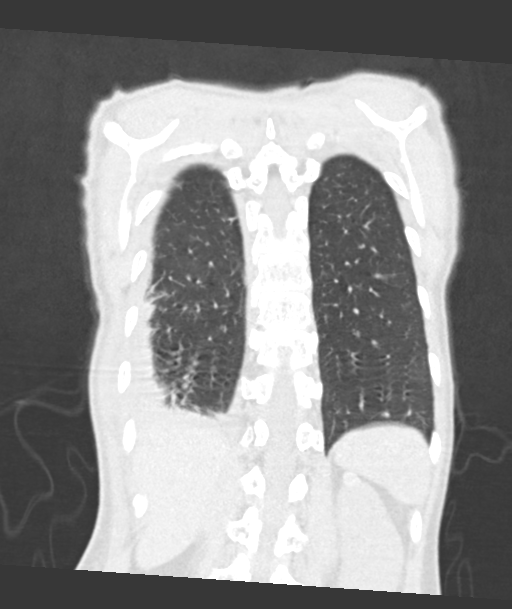
[im 43/106  lung]
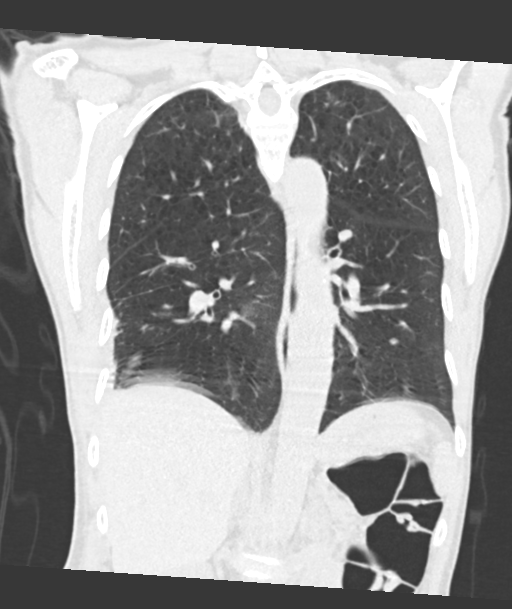
[im 64/106  lung]
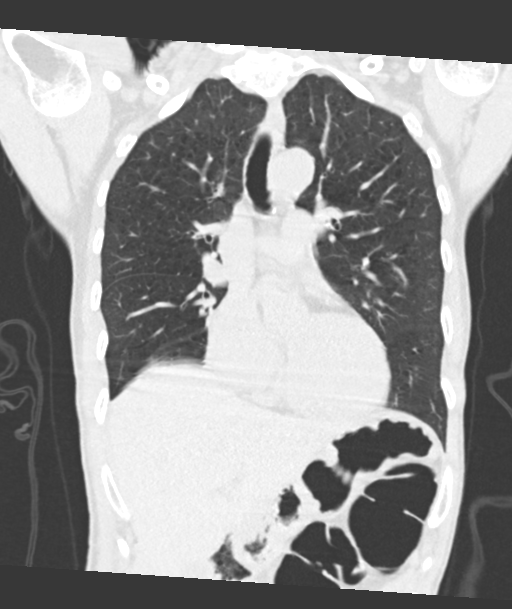

[15 of 36 positions shown; findings below may reference images not displayed]

FINDINGS: Cardiovascular: No significant vascular findings. Normal heart size.
No pericardial effusion.

Mediastinum/Nodes: No enlarged mediastinal or axillary lymph nodes.
Thyroid gland, trachea, and esophagus demonstrate no significant
findings.

Lungs/Pleura: Biapical emphysema.  Small right pleural effusion.

Upper Abdomen: No acute abnormality.

Musculoskeletal: There are posterior fractures of the right seventh
through eleventh ribs.
IMPRESSION: 1. Posterior fractures of the right seventh through eleventh ribs
with small right pleural effusion.
2. Emphysema ([id number]).

## 2022-07-26 ENCOUNTER — Telehealth: Payer: Self-pay | Admitting: Family Medicine

## 2022-07-26 NOTE — Telephone Encounter (Signed)
Copied from [place] [id number]. Topic: Medicare AWV >> Jul 26, 2022  9:57 AM Devoria Glassing wrote: Reason for CRM: Left message tor patient to schedule Medicare Annual Wellness Visit (AWV) with [place], [place]  Appointment can be an offiice/telephone or virtual visit;  Please call [phone] ask for Juliann Pulse.

## 2022-12-01 ENCOUNTER — Telehealth: Payer: Self-pay | Admitting: Family Medicine

## 2022-12-01 NOTE — Telephone Encounter (Signed)
Copied from CRM [id number]. Topic: Medicare AWV >> Dec 01, 2022  2:07 PM Payton Doughty wrote: Reason for CRM: LM 12/01/2022 to schedule AWV   Verlee Rossetti; Care Guide Ambulatory Clinical Support [place] [facility] Direct Dial: [phone]

## 2022-12-06 ENCOUNTER — Telehealth: Payer: Self-pay

## 2022-12-06 NOTE — Telephone Encounter (Signed)
Called pt left VM to call back. Pt needs to schedule AWV. Please forward the call to the office. Ask to speak to [facility] to schedule.  KP

## 2023-01-12 ENCOUNTER — Telehealth: Payer: Self-pay | Admitting: Family Medicine

## 2023-01-12 NOTE — Telephone Encounter (Signed)
Copied from CRM [id number]. Topic: Medicare AWV >> Jan 12, 2023  1:58 PM Payton Doughty wrote: Reason for CRM: LM 01/12/2023 to schedule AWV   Verlee Rossetti; Care Guide Ambulatory Clinical Support [place] [facility] Direct Dial: [phone]

## 2023-02-06 ENCOUNTER — Telehealth: Payer: Self-pay | Admitting: *Deleted

## 2023-02-06 NOTE — Telephone Encounter (Signed)
I attempted to contact patient by telephone but was unsuccessful. According to the patient's chart they are due for follow up  with [facility]. I have left a HIPAA compliant message advising the patient to contact [facility]. I will continue to follow up with the patient to make sure this appointment is scheduled.

## 2023-06-29 ENCOUNTER — Telehealth: Payer: Self-pay | Admitting: Family Medicine

## 2023-06-29 NOTE — Telephone Encounter (Signed)
 Copied from CRM [id number]. Topic: Medicare AWV >> Jun 29, 2023  2:19 PM Nathanel DEL wrote: Reason for CRM: Called LVM 06/29/2023 to schedule AWV. Please schedule office or virtual visits.  Nathanel Paschal; Care Guide Ambulatory Clinical Support [place] [facility] Direct Dial: [phone]

## 2023-07-17 ENCOUNTER — Ambulatory Visit: Payer: Medicare HMO | Admitting: Physician Assistant

## 2023-07-17 ENCOUNTER — Encounter: Payer: Self-pay | Admitting: Physician Assistant

## 2023-07-17 VITALS — BP 172/102 | HR 101 | Temp 100.3°F | Ht 64.0 in | Wt 96.0 lb

## 2023-07-17 DIAGNOSIS — J439 Emphysema, unspecified: Secondary | ICD-10-CM | POA: Diagnosis not present

## 2023-07-17 DIAGNOSIS — H9209 Otalgia, unspecified ear: Secondary | ICD-10-CM

## 2023-07-17 DIAGNOSIS — R059 Cough, unspecified: Secondary | ICD-10-CM | POA: Diagnosis not present

## 2023-07-17 DIAGNOSIS — R6889 Other general symptoms and signs: Secondary | ICD-10-CM

## 2023-07-17 DIAGNOSIS — J441 Chronic obstructive pulmonary disease with (acute) exacerbation: Secondary | ICD-10-CM

## 2023-07-17 DIAGNOSIS — R509 Fever, unspecified: Secondary | ICD-10-CM | POA: Diagnosis not present

## 2023-07-17 LAB — POC COVID19 BINAXNOW: SARS Coronavirus 2 Ag: NEGATIVE

## 2023-07-17 LAB — POCT INFLUENZA A/B
Influenza A, POC: NEGATIVE
Influenza B, POC: NEGATIVE

## 2023-07-17 MED ORDER — PREDNISONE 10 MG PO TABS: ORAL_TABLET | ORAL | 0 refills | Status: DC

## 2023-07-17 MED ORDER — DOXYCYCLINE HYCLATE 100 MG PO TABS: 100.0000 mg | ORAL_TABLET | Freq: Two times a day (BID) | ORAL | 0 refills | Status: DC

## 2023-07-17 MED ORDER — BENZONATATE 200 MG PO CAPS: 200.0000 mg | ORAL_CAPSULE | Freq: Three times a day (TID) | ORAL | 0 refills | Status: DC | PRN

## 2023-07-17 NOTE — Progress Notes (Signed)
Date:  07/17/2023   Name:  Lisa Goodwin   DOB:  1955/09/26   MRN:  [id number]   Chief Complaint: Cough  Cough This is a new problem. Episode onset: X3 days. The problem has been unchanged. The cough is Productive of sputum (thick brown mucous). Associated symptoms include ear pain, a fever, headaches, nasal congestion, rhinorrhea and shortness of breath. Associated symptoms comments: Body aches. Treatments tried: ibuprofen. The treatment provided mild relief.   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  Endorses dyspnea.  Does not use any medications for COPD at baseline.  Does not have any environmental allergies.  No known sick contacts.  Does not like prescription or OTC cough syrup.   Medication list has been reviewed and updated.  Current Meds  Medication Sig   benzonatate (TESSALON) 200 MG capsule Take 1 capsule (200 mg total) by mouth 3 (three) times daily as needed for cough.   docusate sodium (COLACE) 100 MG capsule Take 1 tablet once or twice daily as needed for constipation while taking narcotic pain medicine   doxycycline (VIBRA-TABS) 100 MG tablet Take 1 tablet (100 mg total) by mouth 2 (two) times daily for 7 days. Do not take with dairy. This medication INCREASES SUN SENSITIVITY so avoid direct sunlight.   predniSONE (DELTASONE) 10 MG tablet Take 6 tablets (60 mg total) by mouth daily with breakfast for 1 day, THEN 5 tablets (50 mg total) daily with breakfast for 1 day, THEN 4 tablets (40 mg total) daily with breakfast for 1 day, THEN 3 tablets (30 mg total) daily with breakfast for 1 day, THEN 2 tablets (20 mg total) daily with breakfast for 1 day, THEN 1 tablet (10 mg total) daily with breakfast for 1 day.     Review of Systems  Constitutional:  Positive for fever.  HENT:  Positive for ear pain and rhinorrhea.    Respiratory:  Positive for cough and shortness of breath.   Neurological:  Positive for headaches.    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   Hammertoe 11/08/2015    No Known Allergies  Immunization History  Administered Date(s) Administered   Moderna Sars-Covid-2 Vaccination 12/02/2019, 01/01/2020, 07/16/2020    Past Surgical History:  Procedure Laterality Date   FOOT SURGERY      Social History   Tobacco Use   Smoking status: Former   Smokeless tobacco: Never  Substance Use Topics   Alcohol use: No   Drug use: Never    Family History  Problem Relation Age of Onset   Cancer Mother    Hypertension Brother         04/05/2022   11:01 AM 07/12/2021    9:38 AM 10/26/2020    2:11 PM 01/31/2020    3:29 PM  GAD 7 : Generalized Anxiety Score  Nervous, Anxious, on Edge 0 0 0 0  Control/stop worrying 0 0 0 1  Worry too much - different things 0 0 0 1  Trouble relaxing 0 0 0 2  Restless 0 0 0 1  Easily annoyed or irritable 0 0 0 0  Afraid - awful might happen 0 0 0 0  Total GAD 7 Score 0 0 0 5  Anxiety Difficulty Not difficult at all  Not difficult at all  Not difficult at all       04/05/2022   11:01 AM 07/12/2021    9:38 AM 10/26/2020    2:11 PM  Depression screen PHQ 2/9  Decreased Interest 0 0 0  Down, Depressed, Hopeless 0 0 0  PHQ - 2 Score 0 0 0  Altered sleeping 0 0 0  Tired, decreased energy 0 0 0  Change in appetite 0 0 0  Feeling bad or failure about yourself  0 0 0  Trouble concentrating 0 0 0  Moving slowly or fidgety/restless 0 0 0  Suicidal thoughts 0 0 0  PHQ-9 Score 0 0 0  Difficult doing work/chores Not difficult at all Not difficult at all     BP Readings from Last 3 Encounters:  07/17/23 (!) 172/102  04/05/22 120/78  10/26/20 120/80    Wt Readings from Last 3 Encounters:  07/17/23 96 lb (43.5 kg)  04/05/22 101 lb (45.8 kg)  10/26/20 100 lb  (45.4 kg)    BP (!) 172/102 (BP Location: Left Arm, Patient Position: Sitting, Cuff Size: Normal)   Pulse (!) 101   Temp 100.3 F (37.9 C)   Ht 5\' 4"  (1.626 m)   Wt 96 lb (43.5 kg)   SpO2 93%   BMI 16.48 kg/m   Physical Exam Vitals and nursing note reviewed.  Constitutional:      General: She is not in acute distress.    Appearance: Normal appearance.  HENT:     Right Ear: Tympanic membrane normal.     Left Ear: Tympanic membrane normal.     Ears:     Comments: EAC clear bilaterally with good view of TM which is without effusion or erythema.     Nose: Nose normal.     Comments: Sinuses nontender    Mouth/Throat:     Mouth: Mucous membranes are moist.     Pharynx: No oropharyngeal exudate or posterior oropharyngeal erythema.  Eyes:     Conjunctiva/sclera: Conjunctivae normal.     Pupils: Pupils are equal, round, and reactive to light.  Cardiovascular:     Rate and Rhythm: Regular rhythm. Tachycardia present.     Heart sounds: No murmur heard.    No friction rub. No gallop.  Pulmonary:     Effort: Pulmonary effort is normal.     Breath sounds: Decreased breath sounds present. No wheezing, rhonchi or rales.  Abdominal:     General: There is no distension.  Musculoskeletal:        General: Normal range of motion.  Lymphadenopathy:     Cervical: No cervical adenopathy.  Skin:    General: Skin is warm and dry.  Neurological:     Mental Status: She is alert and oriented to person, place, and time.     Gait: Gait is intact.  Psychiatric:        Mood and Affect: Mood and affect normal.     Recent Labs     Component Value Date/Time   NA 143 04/17/2017 1017   K 5.3 (H) 04/17/2017 1017   CL 101 04/17/2017 1017   CO2 23 04/17/2017 1017   GLUCOSE 83 04/17/2017 1017   GLUCOSE 103 (H) 12/24/2014 2157   BUN 14 04/17/2017 1017   CREATININE 0.77 04/17/2017 1017   CALCIUM 10.5 (H) 04/17/2017 1017   PROT 7.3 12/24/2014 2157   ALBUMIN 5.3 (H) 04/17/2017 1017   AST 22  12/24/2014 2157   ALT 15 12/24/2014 2157  ALKPHOS 83 12/24/2014 2157   BILITOT 0.3 12/24/2014 2157   GFRNONAA 84 04/17/2017 1017   GFRAA 96 04/17/2017 1017    Lab Results  Component Value Date   WBC 9.3 04/17/2017   HGB 16.1 (H) 04/17/2017   HCT 48.0 (H) 04/17/2017   MCV 99 (H) 04/17/2017   PLT 308 04/17/2017   No results found for: "HGBA1C" No results found for: "CHOL", "HDL", "LDLCALC", "LDLDIRECT", "TRIG", "CHOLHDL" Lab Results  Component Value Date   TSH 1.380 04/17/2017     Assessment and Plan:  1. COPD exacerbation (HCC) (Primary) Oxygenating fairly well in the mid to low 90s in clinic, unsure of her baseline as it does not appear to be typically measured here at prior visits.  Due to quiet breath sounds, fever, and tachycardia, will treat as COPD exacerbation covering for pneumonia with doxycycline and opening the airways with prednisone.  She is to practice relative rest for the next several days.  Benzonatate for symptomatic relief.   Encouraged CXR today, but patient will defer for now.  Knows she may return at any time for this.  Contact precautions advised to limit spread. Encouraged mask wearing and good hand hygiene especially before meals. Call if acutely worsening symptoms or if no improvement after Day 7 of illness  Very strongly encouraged vaccinations for influenza, Prevnar 20, RSV once she is feeling better.   - predniSONE (DELTASONE) 10 MG tablet; Take 6 tablets (60 mg total) by mouth daily with breakfast for 1 day, THEN 5 tablets (50 mg total) daily with breakfast for 1 day, THEN 4 tablets (40 mg total) daily with breakfast for 1 day, THEN 3 tablets (30 mg total) daily with breakfast for 1 day, THEN 2 tablets (20 mg total) daily with breakfast for 1 day, THEN 1 tablet (10 mg total) daily with breakfast for 1 day.  Dispense: 21 tablet; Refill: 0 - doxycycline (VIBRA-TABS) 100 MG tablet; Take 1 tablet (100 mg total) by mouth 2 (two) times daily for 7 days. Do  not take with dairy. This medication INCREASES SUN SENSITIVITY so avoid direct sunlight.  Dispense: 14 tablet; Refill: 0 - benzonatate (TESSALON) 200 MG capsule; Take 1 capsule (200 mg total) by mouth 3 (three) times daily as needed for cough.  Dispense: 20 capsule; Refill: 0  2. Pulmonary emphysema, unspecified emphysema type (HCC) Plan as above.  Might consider rescue inhaler in the near future if felt to be clinically appropriate  3. Flu-like symptoms Negative flu and COVID today - POC COVID-19 BinaxNow - POCT Influenza A/B   Return if symptoms worsen or fail to improve.    Alvester Morin, PA-C, DMSc, Nutritionist [facility] [phone]

## 2023-07-20 ENCOUNTER — Telehealth: Payer: Self-pay

## 2023-07-20 ENCOUNTER — Ambulatory Visit
Admission: RE | Admit: 2023-07-20 | Discharge: 2023-07-20 | Disposition: A | Discharge status: Home / Self Care | Payer: Medicare HMO | Attending: Family Medicine | Admitting: Family Medicine

## 2023-07-20 ENCOUNTER — Ambulatory Visit: Payer: Self-pay | Admitting: *Deleted

## 2023-07-20 ENCOUNTER — Ambulatory Visit
Admission: RE | Admit: 2023-07-20 | Discharge: 2023-07-20 | Disposition: A | Discharge status: Home / Self Care | Payer: Medicare HMO | Source: Ambulatory Visit | Attending: Family Medicine | Admitting: Family Medicine

## 2023-07-20 ENCOUNTER — Telehealth: Payer: Medicare HMO | Admitting: Family Medicine

## 2023-07-20 ENCOUNTER — Other Ambulatory Visit: Payer: Self-pay

## 2023-07-20 DIAGNOSIS — R059 Cough, unspecified: Secondary | ICD-10-CM | POA: Diagnosis not present

## 2023-07-20 DIAGNOSIS — J449 Chronic obstructive pulmonary disease, unspecified: Secondary | ICD-10-CM | POA: Diagnosis not present

## 2023-07-20 DIAGNOSIS — J441 Chronic obstructive pulmonary disease with (acute) exacerbation: Secondary | ICD-10-CM

## 2023-07-20 NOTE — Telephone Encounter (Signed)
Reason for Disposition  [1] Longstanding difficulty breathing AND [2] not responding to usual therapy  Answer Assessment - Initial Assessment Questions 1. 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 ONSET: "When did this breathing problem begin?"      Was SOB Monday- but not as bad- progressively worse 3. PATTERN "Does the difficult breathing come and go, or has it been constant since it started?"      With activity 4. SEVERITY: "How bad is your breathing?" (e.g., mild, moderate, severe)    - MILD: No SOB at rest, mild SOB with walking, speaks normally in sentences, can lie down, no retractions, pulse < 100.    - MODERATE: SOB at rest, SOB with minimal exertion and prefers to sit, cannot lie down flat, speaks in phrases, mild retractions, audible wheezing, pulse 100-120.    - SEVERE: Very SOB at rest, speaks in single words, struggling to breathe, sitting hunched forward, retractions, pulse > 120      Mild/moderate 5. RECURRENT SYMPTOM: "Have you had difficulty breathing before?" If Yes, ask: "When was the last time?" and "What happened that time?"      Never this bad 6. CARDIAC HISTORY: "Do you have any history of heart disease?" (e.g., heart attack, angina, bypass surgery, angioplasty)      no 7. LUNG HISTORY: "Do you have any history of lung disease?"  (e.g., pulmonary embolus, asthma, emphysema)     emphysema 8. CAUSE: "What do you think is causing the breathing problem?"      URI: prescribed  9. OTHER SYMPTOMS: "Do you have any other symptoms? (e.g., dizziness, runny nose, cough, chest pain, fever)     Runny nose, cough-at night  Protocols used: Breathing Difficulty-A-AH

## 2023-07-20 NOTE — Telephone Encounter (Signed)
Called pt X2 times. Left Vm for pt to go to the ER. Pt is having SOB. Pt was on the schedule for a VV. We dont not do VV for SOB pt needs her lungs listened to and a possible breathing treatment in office. Pt name was staed on VM. Will try to call the patient again.  KP

## 2023-07-20 NOTE — Telephone Encounter (Signed)
Pt declined to go to UC as well. Pt stated she has been like that for a week so she could wait.  KP

## 2023-07-20 NOTE — Telephone Encounter (Signed)
Summary: xray chest   Pt was in Monday and was told by Tillie Fantasia if she decides to have a chest xray to call him back.  She does want to get the chest xray.  CB#  [phone]     Reason for Disposition  [1] Caller requesting NON-URGENT health information AND [2] PCP's office is the best resource  Answer Assessment - Initial Assessment Questions 1. REASON FOR CALL or QUESTION: "What is your reason for calling today?" or "How can I best help you?" or "What question do you have that I can help answer?"     Patient requesting chest X-ray- please call when order is in. See OV 07/17/23  Protocols used: Information Only Call - No Triage-A-AH

## 2023-07-20 NOTE — Telephone Encounter (Signed)
  Chief Complaint: OV 07/17/23- was encouraged to have chest x-ray- patient has decided to do that- please place order Symptoms: cough, congestion  Disposition: [] ED /[] Urgent Care (no appt availability in office) / [] Appointment(In office/virtual)/ []  [place] [facility]/ [] Home Care/ [] Refused Recommended Disposition /[] [place] [facility]/ [x]  Follow-up with PCP Additional Notes: Patient requesting order for chest x-ray- please let her know when it has been put in

## 2023-07-20 NOTE — Telephone Encounter (Signed)
Called pt she declined to go to the ER. Pt wants to keep her appt for tomorrow.  KP

## 2023-07-20 NOTE — Telephone Encounter (Signed)
Chest XR ordered. Patient informed.  - Lisa Goodwin

## 2023-07-20 NOTE — Telephone Encounter (Signed)
  Chief Complaint: SOB with exertion Symptoms: wheezing, SOB, patient was seen in office 1/20 and is presently using prednisone, antibiotic and tessalon. Not really having the improvement expected Frequency: 1/20- worse last couple days Pertinent Negatives: Patient denies fever now Disposition: [] ED /[] Urgent Care (no appt availability in office) / [x] Appointment(In office/virtual)/ []  [place] [facility]/ [] Home Care/ [] Refused Recommended Disposition /[] [place] [facility]/ []  Follow-up with PCP Additional Notes: Patient has been scheduled for follow up because she requested chest x-ray due to SOB with exertion and not sleeping at night due to SOB. Patient states she is just not improving as she would have expected.

## 2023-07-21 ENCOUNTER — Telehealth: Payer: Self-pay | Admitting: Family Medicine

## 2023-07-21 ENCOUNTER — Observation Stay
Admission: EM | Admit: 2023-07-21 | Discharge: 2023-07-22 | Disposition: A | Discharge status: Home / Self Care | Payer: Medicare HMO | Attending: Osteopathic Medicine | Admitting: Osteopathic Medicine

## 2023-07-21 ENCOUNTER — Emergency Department: Payer: Medicare HMO

## 2023-07-21 ENCOUNTER — Other Ambulatory Visit: Payer: Self-pay

## 2023-07-21 ENCOUNTER — Ambulatory Visit (INDEPENDENT_AMBULATORY_CARE_PROVIDER_SITE_OTHER): Payer: Medicare HMO | Admitting: Family Medicine

## 2023-07-21 ENCOUNTER — Ambulatory Visit: Payer: Self-pay | Admitting: *Deleted

## 2023-07-21 VITALS — BP 112/74 | HR 92 | Ht 64.0 in | Wt 96.0 lb

## 2023-07-21 DIAGNOSIS — J09X9 Influenza due to identified novel influenza A virus with other manifestations: Secondary | ICD-10-CM | POA: Diagnosis not present

## 2023-07-21 DIAGNOSIS — Z1152 Encounter for screening for COVID-19: Secondary | ICD-10-CM | POA: Diagnosis not present

## 2023-07-21 DIAGNOSIS — R918 Other nonspecific abnormal finding of lung field: Secondary | ICD-10-CM | POA: Diagnosis not present

## 2023-07-21 DIAGNOSIS — R051 Acute cough: Secondary | ICD-10-CM | POA: Diagnosis not present

## 2023-07-21 DIAGNOSIS — J9601 Acute respiratory failure with hypoxia: Secondary | ICD-10-CM | POA: Insufficient documentation

## 2023-07-21 DIAGNOSIS — Z87891 Personal history of nicotine dependence: Secondary | ICD-10-CM | POA: Diagnosis not present

## 2023-07-21 DIAGNOSIS — J101 Influenza due to other identified influenza virus with other respiratory manifestations: Principal | ICD-10-CM | POA: Diagnosis present

## 2023-07-21 DIAGNOSIS — R0902 Hypoxemia: Secondary | ICD-10-CM

## 2023-07-21 DIAGNOSIS — J441 Chronic obstructive pulmonary disease with (acute) exacerbation: Secondary | ICD-10-CM

## 2023-07-21 DIAGNOSIS — I10 Essential (primary) hypertension: Secondary | ICD-10-CM | POA: Diagnosis not present

## 2023-07-21 DIAGNOSIS — R0602 Shortness of breath: Secondary | ICD-10-CM

## 2023-07-21 LAB — CBC WITH DIFFERENTIAL/PLATELET
Abs Immature Granulocytes: 0.02 10*3/uL (ref 0.00–0.07)
Basophils Absolute: 0 10*3/uL (ref 0.0–0.1)
Basophils Relative: 0 %
Eosinophils Absolute: 0 10*3/uL (ref 0.0–0.5)
Eosinophils Relative: 0 %
HCT: 49.4 % — ABNORMAL HIGH (ref 36.0–46.0)
Hemoglobin: 16.1 g/dL — ABNORMAL HIGH (ref 12.0–15.0)
Immature Granulocytes: 0 %
Lymphocytes Relative: 12 %
Lymphs Abs: 0.9 10*3/uL (ref 0.7–4.0)
MCH: 32.9 pg (ref 26.0–34.0)
MCHC: 32.6 g/dL (ref 30.0–36.0)
MCV: 100.8 fL — ABNORMAL HIGH (ref 80.0–100.0)
Monocytes Absolute: 0.4 10*3/uL (ref 0.1–1.0)
Monocytes Relative: 5 %
Neutro Abs: 6.1 10*3/uL (ref 1.7–7.7)
Neutrophils Relative %: 83 %
Platelets: 221 10*3/uL (ref 150–400)
RBC: 4.9 MIL/uL (ref 3.87–5.11)
RDW: 12.1 % (ref 11.5–15.5)
WBC: 7.5 10*3/uL (ref 4.0–10.5)
nRBC: 0 % (ref 0.0–0.2)

## 2023-07-21 LAB — BASIC METABOLIC PANEL
Anion gap: 14 (ref 5–15)
BUN: 23 mg/dL (ref 8–23)
CO2: 28 mmol/L (ref 22–32)
Calcium: 9.8 mg/dL (ref 8.9–10.3)
Chloride: 99 mmol/L (ref 98–111)
Creatinine, Ser: 0.69 mg/dL (ref 0.44–1.00)
GFR, Estimated: >60 mL/min (ref >60)
Glucose, Bld: 117 mg/dL — ABNORMAL HIGH (ref 70–99)
Potassium: 4 mmol/L (ref 3.5–5.1)
Sodium: 141 mmol/L (ref 135–145)

## 2023-07-21 LAB — RESP PANEL BY RT-PCR (RSV, FLU A&B, COVID)  RVPGX2
Influenza A by PCR: POSITIVE — AB
Influenza B by PCR: NEGATIVE
Resp Syncytial Virus by PCR: NEGATIVE
SARS Coronavirus 2 by RT PCR: NEGATIVE

## 2023-07-21 LAB — LIPASE, BLOOD: Lipase: 30 U/L (ref 11–51)

## 2023-07-21 LAB — LACTIC ACID, PLASMA
Lactic Acid, Venous: 1.3 mmol/L (ref 0.5–1.9)
Lactic Acid, Venous: 1.4 mmol/L (ref 0.5–1.9)

## 2023-07-21 LAB — TROPONIN I (HIGH SENSITIVITY)
Troponin I (High Sensitivity): 8 ng/L (ref <18)
Troponin I (High Sensitivity): 9 ng/L (ref <18)

## 2023-07-21 LAB — BLOOD GAS, VENOUS

## 2023-07-21 LAB — BRAIN NATRIURETIC PEPTIDE: B Natriuretic Peptide: 276.6 pg/mL — ABNORMAL HIGH (ref 0.0–100.0)

## 2023-07-21 MED ORDER — HYDRALAZINE HCL 20 MG/ML IJ SOLN: 5.0000 mg | Freq: Four times a day (QID) | INTRAMUSCULAR | Status: DC | PRN

## 2023-07-21 MED ORDER — ENOXAPARIN SODIUM 40 MG/0.4ML IJ SOSY
40.0000 mg | PREFILLED_SYRINGE | INTRAMUSCULAR | Status: DC
  Administered 2023-07-21: 40 mg via SUBCUTANEOUS
  Filled 2023-07-21: qty 0.4

## 2023-07-21 MED ORDER — UMECLIDINIUM-VILANTEROL 62.5-25 MCG/ACT IN AEPB: 1.0000 | INHALATION_SPRAY | Freq: Every day | RESPIRATORY_TRACT | Status: DC

## 2023-07-21 MED ORDER — NICOTINE 14 MG/24HR TD PT24: 14.0000 mg | MEDICATED_PATCH | Freq: Every day | TRANSDERMAL | Status: DC | PRN

## 2023-07-21 MED ORDER — IPRATROPIUM-ALBUTEROL 0.5-2.5 (3) MG/3ML IN SOLN
3.0000 mL | Freq: Once | RESPIRATORY_TRACT | Status: AC
  Administered 2023-07-21: 3 mL via RESPIRATORY_TRACT
  Filled 2023-07-21: qty 3

## 2023-07-21 MED ORDER — PREDNISONE 20 MG PO TABS
40.0000 mg | ORAL_TABLET | Freq: Every day | ORAL | Status: DC
  Administered 2023-07-22: 40 mg via ORAL
  Filled 2023-07-21: qty 2

## 2023-07-21 MED ORDER — BUDESONIDE 0.5 MG/2ML IN SUSP
2.0000 mg | Freq: Two times a day (BID) | RESPIRATORY_TRACT | Status: DC
  Administered 2023-07-21: 2 mg via RESPIRATORY_TRACT
  Administered 2023-07-22: 0.5 mg via RESPIRATORY_TRACT
  Filled 2023-07-21 (×2): qty 8

## 2023-07-21 MED ORDER — IPRATROPIUM-ALBUTEROL 0.5-2.5 (3) MG/3ML IN SOLN
3.0000 mL | Freq: Four times a day (QID) | RESPIRATORY_TRACT | Status: DC
  Administered 2023-07-21 – 2023-07-22 (×3): 3 mL via RESPIRATORY_TRACT
  Filled 2023-07-21 (×3): qty 3

## 2023-07-21 NOTE — ED Provider Notes (Signed)
Lisa Goodwin Provider Note    Event Date/Time   First MD Initiated Contact with Patient 07/21/23 1451     (approximate)   History   Shortness of Breath   HPI  Lisa Goodwin is a 68 y.o. female with history of smoking, emphysema but no actual diagnosis of COPD per patient presenting with shortness of breath.  States that on Sunday she had a cough, shortness of breath, subjective fever.  Went to see her primary care doctor and was started on doxycycline as well as prednisone.  States that she has been taking her medications and has not taken her doxycycline today.  Went to get rechecked with her primary care doctor and was found to have her sats in the 80s, is not typically on oxygen.  Was placed on 2 L and sent to the emergency department for evaluation.  She denies any history of blood clots, no unilateral calf swelling or tenderness, no recent travel or surgeries, no history of heart problems, diabetes, hypertension.  Not on any OCPs.  Additional history was obtained from family members as well as partner, patient has prior history of her factors but none recently, no falls or trauma.  Independent chart review she was evaluated on 20th by her doctor's office, presented with URI symptoms and started her on prednisone, states that she does have history of pulmonary emphysema in the note but has not started her on any inhalers.  She had a negative flu and COVID done that day.     Physical Exam   Triage Vital Signs: ED Triage Vitals  Encounter Vitals Group     BP 07/21/23 1212 (!) 184/91     Systolic BP Percentile --      Diastolic BP Percentile --      Pulse Rate 07/21/23 1212 81     Resp 07/21/23 1212 19     Temp 07/21/23 1308 98 F (36.7 C)     Temp src --      SpO2 07/21/23 1212 (!) 89 %     Weight 07/21/23 1210 95 lb (43.1 kg)     Height 07/21/23 1210 5\' 4"  (1.626 m)     Head Circumference --      Peak Flow --      Pain Score 07/21/23 1210 0      Pain Loc --      Pain Education --      Exclude from Growth Chart --     Most recent vital signs: Vitals:   07/21/23 1212 07/21/23 1308  BP: (!) 184/91   Pulse: 81   Resp: 19   Temp:  98 F (36.7 C)  SpO2: (!) 89%      General: Awake, no distress.  CV:  Good peripheral perfusion.  Resp:  Normal effort.  No wheezing on exam, diminished in the bases more so on the right  Abd:  No distention.  Abdomen soft nontender Other:  No lower extremity IMA, no unilateral calf swelling or tenderness   ED Results / Procedures / Treatments   Labs (all labs ordered are listed, but only abnormal results are displayed) Labs Reviewed  RESP PANEL BY RT-PCR (RSV, FLU A&B, COVID)  [id number] - Abnormal; Notable for the following components:      Result Value   Influenza A by PCR POSITIVE (*)    All other components within normal limits  BASIC METABOLIC PANEL - Abnormal; Notable for the following components:   Glucose,  Bld 117 (*)    All other components within normal limits  CBC WITH DIFFERENTIAL/PLATELET - Abnormal; Notable for the following components:   Hemoglobin 16.1 (*)    HCT 49.4 (*)    MCV 100.8 (*)    All other components within normal limits  BRAIN NATRIURETIC PEPTIDE - Abnormal; Notable for the following components:   B Natriuretic Peptide 276.6 (*)    All other components within normal limits  BLOOD GAS, VENOUS - Abnormal; Notable for the following components:   pO2, Ven <31 (*)    Bicarbonate 36.5 (*)    Acid-Base Excess 9.5 (*)    All other components within normal limits  LIPASE, BLOOD  LACTIC ACID, PLASMA  LACTIC ACID, PLASMA  TROPONIN I (HIGH SENSITIVITY)  TROPONIN I (HIGH SENSITIVITY)     EKG  Sinus rhythm with premature atrial complexes, rate 77, normal QRS, normal QTc, T wave flattening in V2, no ischemic ST elevation, no prior to compare   RADIOLOGY Chest x-ray on my interpretation without any focal consolidation   PROCEDURES:  Critical Care performed:  Yes, see critical care procedure note(s)  .Critical Care  Performed by: Claybon Jabs, MD Authorized by: Claybon Jabs, MD   Critical care provider statement:    Critical care time (minutes):  40   Critical care was time spent personally by me on the following activities:  Development of treatment plan with patient or surrogate, discussions with consultants, evaluation of patient's response to treatment, examination of patient, ordering and review of laboratory studies, ordering and review of radiographic studies, ordering and performing treatments and interventions, pulse oximetry, re-evaluation of patient's condition and review of old charts    MEDICATIONS ORDERED IN ED: Medications  ipratropium-albuterol (DUONEB) 0.5-2.5 (3) MG/3ML nebulizer solution 3 mL (3 mLs Nebulization Given 07/21/23 1548)     IMPRESSION / MDM / ASSESSMENT AND PLAN / ED COURSE  I reviewed the triage vital signs and the nursing notes.                              Differential diagnosis includes, but is not limited to, considered pneumonia, CHF, no wheezing to clearly suggest COPD or asthma, also considered PE but patient has no other risk factors other than the hypoxia currently.  Also consider viral illness, COVID, flu and RSV.  Will get labs, chest x-ray, EKG, troponin, BNP, viral swab.  Will trial a DuoNeb and see if this opens her up.  Reassess.  Will likely need to be admitted especially we cannot titrate her off the oxygen.  Patient's presentation is most consistent with acute presentation with potential threat to life or bodily function.  Chest x-ray without any focal consolidation, no other risk factors.  Initially put in a CT PE study but found later to be fluid positive which fits her symptomatology.  Given that she actually has no risk factors for PE at this time, favor hypoxia in the setting of influenza A.  Given that she is high risk and hypoxic, she need to be admitted for further management.  Consulted  hospitalist was agreeable plan for admission will evaluate the patient.  She is admitted.  Independent review of labs, flu A is positive, lactate is normal, troponin is not elevated, BNP is mildly elevated, electrolytes no severely deranged, creatinine is normal, no leukocytosis, lipase is normal, pH is normal and the VBG with pCO2 is normal but her pO2 is low which  is consistent with her hypoxia.      FINAL CLINICAL IMPRESSION(S) / ED DIAGNOSES   Final diagnoses:  Influenza A  Hypoxia  Shortness of breath  Acute cough     Rx / DC Orders   ED Discharge Orders     None        Note:  This document was prepared using Dragon voice recognition software and may include unintentional dictation errors.    Claybon Jabs, MD 07/21/23 [id number]

## 2023-07-21 NOTE — Telephone Encounter (Signed)
Dr Yetta Barre is aware. Dr Yetta Barre sent patient to the hospital.

## 2023-07-21 NOTE — Telephone Encounter (Signed)
Additional Information  [1] Caller is not with the adult (patient) AND [2] reporting urgent symptoms    Patient was just seen in office- I have alerted the office  Answer Assessment - Initial Assessment Questions 1. REASON FOR CALL or QUESTION: "What is your reason for calling today?" or "How can I best help you?" or "What question do you have that I can help answer?"     Patient's friend is calling- patient was just seen in office and she was advised to go to hospital- per friend reporting. ( Low O2 sat-in 80's) Patient is out in her vehicle( white dodge pickup) and feeling faint. I have notified Marchelle Folks at the office and she will see if someone can check/assist patient. Attempted to call patient- did not get answer.Attempted to call patient's friend- she did not answer.  Protocols used: Information Only Call - No Triage-A-AH

## 2023-07-21 NOTE — ED Notes (Addendum)
Pt placed on 2L [place], O2 at 92%

## 2023-07-21 NOTE — H&P (Signed)
HISTORY AND PHYSICAL    Lisa Goodwin   ZOX:[id number] DOB: [birth year]   Date of Service: 07/21/23 Requesting physician/APP from ED: Festus Barren, MD  PCP: Duanne Limerick, MD     HPI: Lisa Goodwin is a 68 y.o. female with history of smoking, emphysema but no actual diagnosis of COPD per patient presenting with shortness of breath.  States that on Sunday she had a cough, shortness of breath, subjective fever.  Went to see her primary care doctor, tested negative for flu/COVID, and was started on doxycycline as well as prednisone.  States that she has been taking her medications and has not taken her doxycycline today.  Went to get rechecked with her primary care doctor and was found to have her sats in the 80s, is not typically on oxygen.  Was placed on 2 L and sent to the emergency department for evaluation. In ED 80s on RA, felt improved on O2, received breathing treatment. Hospitalist service requested to admit patient.     Consultants:  none  Procedures: none      ASSESSMENT & PLAN:   Influenza A Acute on likely chronic COPD d/t Influenza Acute hypoxic respiratory failure, unknown if chronic Supplemental O2 Ambulation test in AM to determine need for home O2 DuoNeb q6h scheduled and q2h prn Budesonide neb q12h  Likely will discharge on LAMA/LABA No abx needed, CXR no infiltrate  Outside window for Tamiflu   Elevated blood pressure uncertain if essential HTN as this appears new Hydralazine prn  If persists high may consider starting antihypertensive on discharge w/ close PCP f/u     DVT prophylaxis: lovenox  Pertinent IV fluids/nutrition: no continuous IV fluids, regular diet  Central lines / invasive devices: none  Code Status: DNR - she states she would not want CPR or intubation  Family Communication: support person is at bedside on admission  Disposition: observation, Med-Surg TOC needs: none at this time, anticipate may need home O2  Barriers  to discharge / significant pending items: O2 but anticipate home tomorrow                   Review of Systems:  Review of Systems  Constitutional:  Positive for malaise/fatigue (better today than it's been). Negative for chills and fever (recent but now resolved).  HENT:  Negative for congestion, nosebleeds and sinus pain.   Eyes:  Negative for blurred vision.  Respiratory:  Positive for cough and shortness of breath. Negative for hemoptysis and sputum production.   Cardiovascular:  Negative for chest pain, palpitations, orthopnea and leg swelling.  Gastrointestinal:  Negative for abdominal pain, heartburn, nausea and vomiting.  Musculoskeletal:  Negative for myalgias.  Neurological:  Negative for dizziness, loss of consciousness and weakness.  Psychiatric/Behavioral:  Negative for depression.        has no past medical history on file.  No chronic prescriptions   No Known Allergies  family history includes Cancer in her mother; Hypertension in her brother.  Past Surgical History:  Procedure Laterality Date   FOOT SURGERY            Objective Findings:  Vitals:   07/21/23 1210 07/21/23 1212 07/21/23 1308  BP:  (!) 184/91   Pulse:  81   Resp:  19   Temp:   98 F (36.7 C)  SpO2:  (!) 89%   Weight: 43.1 kg    Height: 5\' 4"  (1.626 m)     No intake or output data  in the 24 hours ending 07/21/23 1814 Filed Weights   07/21/23 1210  Weight: 43.1 kg    Examination:  Physical Exam Constitutional:      General: She is not in acute distress. Cardiovascular:     Rate and Rhythm: Normal rate and regular rhythm.  Pulmonary:     Effort: Pulmonary effort is normal.     Breath sounds: Decreased breath sounds present.  Abdominal:     Palpations: Abdomen is soft.  Musculoskeletal:     Right lower leg: No edema.     Left lower leg: No edema.  Neurological:     General: No focal deficit present.     Mental Status: She is alert and oriented to person,  place, and time.  Psychiatric:        Mood and Affect: Mood normal.        Behavior: Behavior normal.          Scheduled Medications:   budesonide (PULMICORT) nebulizer solution  2 mg Nebulization Q12H   enoxaparin (LOVENOX) injection  40 mg Subcutaneous Q24H   ipratropium-albuterol  3 mL Nebulization Q6H   [START ON 07/22/2023] predniSONE  40 mg Oral Q breakfast    Continuous Infusions:   PRN Medications:  nicotine  Antimicrobials:  Anti-infectives (From admission, onward)    None           Data Reviewed: I have personally reviewed following labs and imaging studies  CBC: Recent Labs  Lab 07/21/23 1211  WBC 7.5  NEUTROABS 6.1  HGB 16.1*  HCT 49.4*  MCV 100.8*  PLT 221   Basic Metabolic Panel: Recent Labs  Lab 07/21/23 1211  NA 141  K 4.0  CL 99  CO2 28  GLUCOSE 117*  BUN 23  CREATININE 0.69  CALCIUM 9.8   GFR: Estimated Creatinine Clearance: 45.8 mL/min (by C-G formula based on SCr of 0.69 mg/dL). Liver Function Tests: No results for input(s): "AST", "ALT", "ALKPHOS", "BILITOT", "PROT", "ALBUMIN" in the last 168 hours. Recent Labs  Lab 07/21/23 1542  LIPASE 30   No results for input(s): "AMMONIA" in the last 168 hours. Coagulation Profile: No results for input(s): "INR", "PROTIME" in the last 168 hours. Cardiac Enzymes: No results for input(s): "CKTOTAL", "CKMB", "CKMBINDEX", "TROPONINI" in the last 168 hours. BNP (last 3 results) No results for input(s): "PROBNP" in the last 8760 hours. HbA1C: No results for input(s): "HGBA1C" in the last 72 hours. CBG: No results for input(s): "GLUCAP" in the last 168 hours. Lipid Profile: No results for input(s): "CHOL", "HDL", "LDLCALC", "TRIG", "CHOLHDL", "LDLDIRECT" in the last 72 hours. Thyroid Function Tests: No results for input(s): "TSH", "T4TOTAL", "FREET4", "T3FREE", "THYROIDAB" in the last 72 hours. Anemia Panel: No results for input(s): "VITAMINB12", "FOLATE", "FERRITIN", "TIBC",  "IRON", "RETICCTPCT" in the last 72 hours. Most Recent Urinalysis On File:  No results found for: "COLORURINE", "APPEARANCEUR", "LABSPEC", "PHURINE", "GLUCOSEU", "HGBUR", "BILIRUBINUR", "KETONESUR", "PROTEINUR", "UROBILINOGEN", "NITRITE", "LEUKOCYTESUR" Sepsis Labs: @LABRCNTIP (procalcitonin:4,lacticidven:4)  Recent Results (from the past 240 hours)  Resp panel by RT-PCR (RSV, Flu A&B, Covid) Anterior Nasal Swab     Status: Abnormal   Collection Time: 07/21/23  3:42 PM   Specimen: Anterior Nasal Swab  Result Value Ref Range Status   SARS Coronavirus 2 by RT PCR NEGATIVE NEGATIVE Final    Comment: (NOTE) SARS-CoV-2 target nucleic acids are NOT DETECTED.  The SARS-CoV-2 RNA is generally detectable in upper respiratory specimens during the acute phase of infection. The lowest concentration of SARS-CoV-2 viral copies this  assay can detect is 138 copies/mL. A negative result does not preclude SARS-Cov-2 infection and should not be used as the sole basis for treatment or other patient management decisions. A negative result may occur with  improper specimen collection/handling, submission of specimen other than nasopharyngeal swab, presence of viral mutation(s) within the areas targeted by this assay, and inadequate number of viral copies(<138 copies/mL). A negative result must be combined with clinical observations, patient history, and epidemiological information. The expected result is Negative.  Fact Sheet for Patients:  BloggerCourse.com  Fact Sheet for Healthcare Providers:  SeriousBroker.it  This test is no t yet approved or cleared by the [place] FDA and  has been authorized for detection and/or diagnosis of SARS-CoV-2 by FDA under an Emergency Use Authorization (EUA). This EUA will remain  in effect (meaning this test can be used) for the duration of the COVID-19 declaration under Section 564(b)(1) of the Act,  21 U.S.C.section 360bbb-3(b)(1), unless the authorization is terminated  or revoked sooner.       Influenza A by PCR POSITIVE (A) NEGATIVE Final   Influenza B by PCR NEGATIVE NEGATIVE Final    Comment: (NOTE) The Xpert Xpress SARS-CoV-2/FLU/RSV plus assay is intended as an aid in the diagnosis of influenza from Nasopharyngeal swab specimens and should not be used as a sole basis for treatment. Nasal washings and aspirates are unacceptable for Xpert Xpress SARS-CoV-2/FLU/RSV testing.  Fact Sheet for Patients: BloggerCourse.com  Fact Sheet for Healthcare Providers: SeriousBroker.it  This test is not yet approved or cleared by the [place] FDA and has been authorized for detection and/or diagnosis of SARS-CoV-2 by FDA under an Emergency Use Authorization (EUA). This EUA will remain in effect (meaning this test can be used) for the duration of the COVID-19 declaration under Section 564(b)(1) of the Act, 21 U.S.C. section 360bbb-3(b)(1), unless the authorization is terminated or revoked.     Resp Syncytial Virus by PCR NEGATIVE NEGATIVE Final    Comment: (NOTE) Fact Sheet for Patients: BloggerCourse.com  Fact Sheet for Healthcare Providers: SeriousBroker.it  This test is not yet approved or cleared by the [place] FDA and has been authorized for detection and/or diagnosis of SARS-CoV-2 by FDA under an Emergency Use Authorization (EUA). This EUA will remain in effect (meaning this test can be used) for the duration of the COVID-19 declaration under Section 564(b)(1) of the Act, 21 U.S.C. section 360bbb-3(b)(1), unless the authorization is terminated or revoked.  Performed at [facility], [address], [place], [place] [address]          Radiology Studies: DG Chest 2 View Result Date: 07/21/2023 CLINICAL DATA:  Shortness of breath. Stopped smoking  last month. Cold symptoms. EXAM: CHEST - 2 VIEW COMPARISON:  Chest radiographs 07/20/2023 in 01/18/2020, 01/13/2020 FINDINGS: Cardiac silhouette and mediastinal contours are within normal limits. Mild-to-moderate atherosclerotic calcification within the aortic arch. Flattening of the diaphragms and moderate hyperinflation. Increased attenuation of the superior pulmonary vasculature, chronic emphysematous changes. No acute airspace opacity is seen. No pleural effusion or pneumothorax. Mild multilevel degenerative disc changes of the thoracic spine. IMPRESSION: 1. No active cardiopulmonary disease. 2. Chronic emphysematous changes. 3. No significant change from yesterday's radiograph. Electronically Signed   By: Neita Garnet M.D.   On: 07/21/2023 15:51   DG Chest 2 View Result Date: 07/20/2023 CLINICAL DATA:  Cough 4 days and COPD. EXAM: CHEST - 2 VIEW COMPARISON:  01/18/2020 FINDINGS: Exam demonstrates lungs to be hyperexpanded with flattening of the hemidiaphragms on  the lateral film compatible with COPD. No focal airspace process or effusion. Cardiomediastinal silhouette and remainder of the exam is unchanged. IMPRESSION: 1. No acute cardiopulmonary disease. 2. COPD. Electronically Signed   By: Elberta Fortis M.D.   On: 07/20/2023 13:33             LOS: 0 days      Sunnie Nielsen, DO [facility] 07/21/2023, 6:14 PM    Dictation software may have been used to generate the above note. Typos may occur and escape review in typed/dictated notes. Please contact Dr Lyn Hollingshead directly for clarity if needed.  Staff may message me via secure chat in Epic  but this may not receive an immediate response,  please page me for urgent matters!  If 7PM-7AM, please contact night coverage [email or web address]

## 2023-07-21 NOTE — Telephone Encounter (Signed)
Went out to parking lot to check on patient. She verbalized she is fine, and feels fine. She is waiting on her neighbor to come pick her up and take her to [facility] per Dr Yetta Barre instructions. Offered to sit with patient and she declined.  - Levell Tavano

## 2023-07-21 NOTE — Progress Notes (Signed)
1. COPD exacerbation (HCC) (Primary) New onset.  Persistent.  Patient began getting shortness of breath earlier in the week once evaluated on 120 and was noted to be in probable COPD exacerbation was started on prednisone and antibiotic and was to be rechecked yesterday but obtaining x-ray it was noted that it was for the most part unremarkable except for emphysematous changes but patient remained dyspneic and was asked to come in for evaluation patient was noted to be somewhat cyanotic in appearance and pulse ox was noted to be low and the upper 80s mid 80s.  Patient underwent nebulization with DuoNeb witching proved her symptomatology but O2 started to decrease in the mid to low 80s.  Given that the hypoxemic appearance in the low pulse ox is patient was asked to go to the emergency room setting for further evaluation.

## 2023-07-21 NOTE — Progress Notes (Signed)
   07/21/23 1415  Spiritual Encounters  Type of Visit Initial  Care provided to: Family;Pt not available  Referral source Chaplain assessment  Reason for visit Routine spiritual support  OnCall Visit Yes  Spiritual Framework  Presenting Themes Values and beliefs;Rituals and practive  Interventions  Spiritual Care Interventions Made Established relationship of care and support;Compassionate presence;Reflective listening;Encouragement  Intervention Outcomes  Outcomes Connection to spiritual care;Awareness of support

## 2023-07-21 NOTE — ED Triage Notes (Signed)
Pt comes with c/o cough cold symptoms for awhile pt went to her pcp and her O2 was low they gave her albuterol treatment. Pt did not have much improvement so they sent her here. Pt denies cp.

## 2023-07-21 NOTE — ED Provider Triage Note (Signed)
Emergency Medicine Provider Triage Evaluation Note  DAMONI Goodwin , a 68 y.o. female  was evaluated in triage.  Pt complains of SOB. Patient was seen by her PCP for URI symptoms. She was hypoxic and was given an albuterol treatment and did not improve so was sent here for further eval. She has already taken doxycycline and prednisone for the URI.   Review of Systems  Positive: SOB, cough Negative: CP  Physical Exam  There were no vitals taken for this visit. Gen:   Awake, no distress   Resp:  Normal effort  MSK:   Moves extremities without difficulty  Other:    Medical Decision Making  Medically screening exam initiated at 12:08 PM.  Appropriate orders placed.  [name] was informed that the remainder of the evaluation will be completed by another provider, this initial triage assessment does not replace that evaluation, and the importance of remaining in the ED until their evaluation is complete.     Cameron Ali, PA-C 07/21/23 1210

## 2023-07-21 NOTE — Telephone Encounter (Signed)
Lisa Goodwin pt's friend is calling in to let Lisa Goodwin know that pt went to the hospital to be admitted.

## 2023-07-21 NOTE — Telephone Encounter (Signed)
  Chief Complaint: Friend of patient- calling to report patient was just seen in office- in the parking lot- not feeling well-faint Symptoms: faint,dizzy- unable to drive Disposition: [] ED /[] Urgent Care (no appt availability in office) / [] Appointment(In office/virtual)/ []  [facility]/ [] Home Care/ [] Refused Recommended Disposition /[] [facility]/ []  Follow-up with PCP Additional Notes: Call to office- reported call- unable to get patient or friend on phone - see additional information in triage note

## 2023-07-22 DIAGNOSIS — J101 Influenza due to other identified influenza virus with other respiratory manifestations: Secondary | ICD-10-CM | POA: Diagnosis not present

## 2023-07-22 MED ORDER — ENSURE ENLIVE PO LIQD
237.0000 mL | Freq: Three times a day (TID) | ORAL | Status: DC
  Administered 2023-07-22: 237 mL via ORAL

## 2023-07-22 MED ORDER — ORAL CARE MOUTH RINSE: 15.0000 mL | OROMUCOSAL | Status: DC | PRN

## 2023-07-22 MED ORDER — ENOXAPARIN SODIUM 30 MG/0.3ML IJ SOSY: 30.0000 mg | PREFILLED_SYRINGE | INTRAMUSCULAR | Status: DC

## 2023-07-22 MED ORDER — IPRATROPIUM-ALBUTEROL 0.5-2.5 (3) MG/3ML IN SOLN: 3.0000 mL | Freq: Four times a day (QID) | RESPIRATORY_TRACT | Status: DC | PRN

## 2023-07-22 MED ORDER — ADULT MULTIVITAMIN W/MINERALS CH
1.0000 | ORAL_TABLET | Freq: Every day | ORAL | Status: DC
  Administered 2023-07-22: 1 via ORAL
  Filled 2023-07-22: qty 1

## 2023-07-22 MED ORDER — BUDESONIDE 0.5 MG/2ML IN SUSP: 0.5000 mg | Freq: Two times a day (BID) | RESPIRATORY_TRACT | Status: DC

## 2023-07-22 MED ORDER — ALBUTEROL SULFATE HFA 108 (90 BASE) MCG/ACT IN AERS: 1.0000 | INHALATION_SPRAY | RESPIRATORY_TRACT | 1 refills | Status: DC | PRN

## 2023-07-22 NOTE — Progress Notes (Signed)
Initial Nutrition Assessment  DOCUMENTATION CODES:   Underweight  INTERVENTION:   -Continue regular diet for widest variety of meal selections -MVI with minerals daily -Ensure Enlive po TID, each supplement provides 350 kcal and 20 grams of protein  NUTRITION DIAGNOSIS:   Increased nutrient needs related to chronic illness (emphysema) as evidenced by estimated needs.  GOAL:   Patient will meet greater than or equal to 90% of their needs  MONITOR:   PO intake, Supplement acceptance  REASON FOR ASSESSMENT:   Consult Assessment of nutrition requirement/status  ASSESSMENT:   Pt with history of smoking, emphysema but no actual diagnosis of COPD per patient presented with shortness of breath  Pt admitted with COPD exacerbation secondary to influenza A.   Pt unavailable at time of visit. Attempted to speak with pt via call to hospital room phone, however, unable to reach. RD unable to obtain further nutrition-related history or complete nutrition-focused physical exam at this time.    Per H&P, symptoms started on Sunday and she tested negative for flu and COVID. Pt outside of window for Tamiflu.   Reviewed wt hx; pt has experienced a 5.9% wt loss over the past 3 months, which is not significant for time frame.   Pt is at high risk for malnutrition given underweight status, however, unable to identify at this time. Pt would greatly benefit from addition of oral nutrition supplements.   Medications reviewed and include lovenox and prednisone.   Labs reviewed.   Diet Order:   Diet Order             Diet regular Room service appropriate? Yes; Fluid consistency: Thin  Diet effective now                   EDUCATION NEEDS:   No education needs have been identified at this time  Skin:  Skin Assessment: Reviewed RN Assessment  Last BM:  Unknown  Height:   Ht Readings from Last 1 Encounters:  07/21/23 5\' 4"  (1.626 m)    Weight:   Wt Readings from Last 1  Encounters:  07/21/23 43.1 kg    Ideal Body Weight:  54.5 kg  BMI:  Body mass index is 16.31 kg/m.  Estimated Nutritional Needs:   Kcal:  1700-1900  Protein:  85-100 grams  Fluid:  > 1.7 L    Levada Schilling, RD, LDN, CDCES Registered Dietitian III Certified Diabetes Care and Education Specialist If unable to reach this RD, please use "RD Inpatient" group chat on secure chat between hours of 8am-4 pm daily

## 2023-07-22 NOTE — TOC Transition Note (Signed)
Transition of Care [facility]) - Discharge Note   Patient Details  Name: Lisa Goodwin MRN: [id number] Date of Birth: 08-19-1955  [facility] of [facility]) CM/SW Contact:  Bing Quarry, RN Phone Number: 07/22/2023, 1:40 PM   Clinical Narrative:  1/25: Admitted 07/21/23 from PCP office for low oxygenation and htx of cough/cold symptoms, under OBS status.  Respiratory panel negative for flu/Covid.   Discharge orders in at 1330 pm today at under 24 hours OBS status. DC home/self care. Patient education noted to have been placed in AVS summary for COPD. Pending any DME home oxygen orders.   To follow up with PCP for consideration of lung function tests for likely COPD per AVS.   PCP: Duanne Limerick, MD  General - Family Medicine  [phone]   Final next level of care: Home/Self Care Barriers to Discharge: No Barriers Identified   Patient Goals and CMS Choice     Choice offered to / list presented to : NA      Discharge Placement                       Discharge Plan and Services Additional resources added to the After Visit Summary for                  DME Arranged: N/A DME Agency: NA       HH Arranged: NA HH Agency: NA        Social Drivers of Health (SDOH) Interventions SDOH Screenings   Food Insecurity: No Food Insecurity (07/22/2023)  Housing: Low Risk  (07/22/2023)  Transportation Needs: No Transportation Needs (07/22/2023)  Utilities: Not At Risk (07/22/2023)  Depression (PHQ2-9): Low Risk  (04/05/2022)  Social Connections: Unknown (07/22/2023)  Tobacco Use: Medium Risk (07/21/2023)     Readmission Risk Interventions     No data to display

## 2023-07-22 NOTE — Discharge Summary (Signed)
Physician Discharge Summary   Patient: Lisa Goodwin MRN: [id number]  DOB: 1956/04/01   Admit:     Date of Admission: 07/21/2023 Admitted from: home   Discharge: Date of discharge: 07/22/23 Disposition: Home Condition at discharge: good  CODE STATUS: DNR     Discharge Physician: Sunnie Nielsen, DO [facility]     PCP: Duanne Limerick, MD  Recommendations for Outpatient Follow-up:  Follow up with PCP Duanne Limerick, MD in 1-2 weeks Please obtain labs/tests: consider PFT, walk test for O2 but suspect she does not need long term oxygen    Discharge Instructions     Diet general   Complete by: As directed    Increase activity slowly   Complete by: As directed          Discharge Diagnoses: Principal Problem:   Influenza A Other: Likely COPD  HTN      Hospital Course: Lisa Goodwin is a 68 y.o. female with history of smoking, emphysema but no actual diagnosis of COPD per patient presenting with shortness of breath.  States that on Sunday she had a cough, shortness of breath, subjective fever.  Went to see her primary care doctor, tested negative for flu/COVID, and was started on doxycycline as well as prednisone.  States that she has been taking her medications and has not taken her doxycycline today.  Went to get rechecked with her primary care doctor and was found to have her sats in the 80s, is not typically on oxygen.  Was placed on 2 L and sent to the emergency department for evaluation. In ED 80s on RA, felt improved on O2, received breathing treatment. Hospitalist service requested to admit patient. She was kept overnight on O2, weaned off this morning. Minimal desaturation on room air w/ ambulation but no significant SOB w/ this and she was offered home O2 but declined, she will take it easy next few days and follow w/ PCP   Consultants:  none   Procedures: none       ASSESSMENT & PLAN:  Influenza A Acute on likely chronic  COPD d/t Influenza Acute hypoxic respiratory failure, unknown if chronic Supplemental O2 --> weaned off, does not likely need long term No abx needed, CXR no infiltrate  Outside window for Tamiflu  Suggest PFT outpatient Albuterol as needed    Elevated blood pressure uncertain if essential HTN as this appears new If persists high may consider starting antihypertensive on PCP f/u              Discharge Instructions  Allergies as of 07/22/2023   No Known Allergies      Medication List     STOP taking these medications    doxycycline 100 MG tablet Commonly known as: VIBRA-TABS   predniSONE 10 MG tablet Commonly known as: DELTASONE       TAKE these medications    albuterol 108 (90 Base) MCG/ACT inhaler Commonly known as: VENTOLIN HFA Inhale 1-2 puffs into the lungs every 4 (four) hours as needed for wheezing or shortness of breath.   benzonatate 200 MG capsule Commonly known as: TESSALON Take 1 capsule (200 mg total) by mouth 3 (three) times daily as needed for cough.   docusate sodium 100 MG capsule Commonly known as: Colace Take 1 tablet once or twice daily as needed for constipation while taking narcotic pain medicine         Follow-up Information     Duanne Limerick, MD  Follow up.   Specialty: Family Medicine Why: hospital follow up, likely COPD< consider lung function test Contact information: [address] [place] [address] [phone]                 No Known Allergies   Subjective: pt feeling improved this morning, ambulating well on room air, desats minimally on room air w/ ambulation but she does not feel dyspneic. Cough improved. Overall feeling better, comfortable going home today w/o O2   Discharge Exam: BP (!) 140/91 (BP Location: Left Arm)   Pulse 71   Temp 98.6 F (37 C) (Oral)   Resp 16   Ht 5\' 4"  (1.626 m)   Wt 43.1 kg   SpO2 93%   BMI 16.31 kg/m  General: Pt is alert, awake, not in acute  distress Cardiovascular: RRR, S1/S2 +, no rubs, no gallops Respiratory: diminished breath sounds, no wheezing/ronchi  Abdominal: Soft, NT, ND, bowel sounds + Extremities: no edema, no cyanosis     The results of significant diagnostics from this hospitalization (including imaging, microbiology, ancillary and laboratory) are listed below for reference.     Microbiology: Recent Results (from the past 240 hours)  Resp panel by RT-PCR (RSV, Flu A&B, Covid) Anterior Nasal Swab     Status: Abnormal   Collection Time: 07/21/23  3:42 PM   Specimen: Anterior Nasal Swab  Result Value Ref Range Status   SARS Coronavirus 2 by RT PCR NEGATIVE NEGATIVE Final    Comment: (NOTE) SARS-CoV-2 target nucleic acids are NOT DETECTED.  The SARS-CoV-2 RNA is generally detectable in upper respiratory specimens during the acute phase of infection. The lowest concentration of SARS-CoV-2 viral copies this assay can detect is 138 copies/mL. A negative result does not preclude SARS-Cov-2 infection and should not be used as the sole basis for treatment or other patient management decisions. A negative result may occur with  improper specimen collection/handling, submission of specimen other than nasopharyngeal swab, presence of viral mutation(s) within the areas targeted by this assay, and inadequate number of viral copies(<138 copies/mL). A negative result must be combined with clinical observations, patient history, and epidemiological information. The expected result is Negative.  Fact Sheet for Patients:  BloggerCourse.com  Fact Sheet for Healthcare Providers:  SeriousBroker.it  This test is no t yet approved or cleared by the [place] FDA and  has been authorized for detection and/or diagnosis of SARS-CoV-2 by FDA under an Emergency Use Authorization (EUA). This EUA will remain  in effect (meaning this test can be used) for the duration of  the COVID-19 declaration under Section 564(b)(1) of the Act, 21 U.S.C.section 360bbb-3(b)(1), unless the authorization is terminated  or revoked sooner.       Influenza A by PCR POSITIVE (A) NEGATIVE Final   Influenza B by PCR NEGATIVE NEGATIVE Final    Comment: (NOTE) The Xpert Xpress SARS-CoV-2/FLU/RSV plus assay is intended as an aid in the diagnosis of influenza from Nasopharyngeal swab specimens and should not be used as a sole basis for treatment. Nasal washings and aspirates are unacceptable for Xpert Xpress SARS-CoV-2/FLU/RSV testing.  Fact Sheet for Patients: BloggerCourse.com  Fact Sheet for Healthcare Providers: SeriousBroker.it  This test is not yet approved or cleared by the [place] FDA and has been authorized for detection and/or diagnosis of SARS-CoV-2 by FDA under an Emergency Use Authorization (EUA). This EUA will remain in effect (meaning this test can be used) for the duration of the COVID-19 declaration under Section 564(b)(1)  of the Act, 21 U.S.C. section 360bbb-3(b)(1), unless the authorization is terminated or revoked.     Resp Syncytial Virus by PCR NEGATIVE NEGATIVE Final    Comment: (NOTE) Fact Sheet for Patients: BloggerCourse.com  Fact Sheet for Healthcare Providers: SeriousBroker.it  This test is not yet approved or cleared by the [place] FDA and has been authorized for detection and/or diagnosis of SARS-CoV-2 by FDA under an Emergency Use Authorization (EUA). This EUA will remain in effect (meaning this test can be used) for the duration of the COVID-19 declaration under Section 564(b)(1) of the Act, 21 U.S.C. section 360bbb-3(b)(1), unless the authorization is terminated or revoked.  Performed at [facility], [address] [place], [place] [address]      Labs: BNP (last 3 results) Recent Labs    07/21/23 1211   BNP 276.6*   Basic Metabolic Panel: Recent Labs  Lab 07/21/23 1211  NA 141  K 4.0  CL 99  CO2 28  GLUCOSE 117*  BUN 23  CREATININE 0.69  CALCIUM 9.8   Liver Function Tests: No results for input(s): "AST", "ALT", "ALKPHOS", "BILITOT", "PROT", "ALBUMIN" in the last 168 hours. Recent Labs  Lab 07/21/23 1542  LIPASE 30   No results for input(s): "AMMONIA" in the last 168 hours. CBC: Recent Labs  Lab 07/21/23 1211  WBC 7.5  NEUTROABS 6.1  HGB 16.1*  HCT 49.4*  MCV 100.8*  PLT 221   Cardiac Enzymes: No results for input(s): "CKTOTAL", "CKMB", "CKMBINDEX", "TROPONINI" in the last 168 hours. BNP: Invalid input(s): "POCBNP" CBG: No results for input(s): "GLUCAP" in the last 168 hours. D-Dimer No results for input(s): "DDIMER" in the last 72 hours. Hgb A1c No results for input(s): "HGBA1C" in the last 72 hours. Lipid Profile No results for input(s): "CHOL", "HDL", "LDLCALC", "TRIG", "CHOLHDL", "LDLDIRECT" in the last 72 hours. Thyroid function studies No results for input(s): "TSH", "T4TOTAL", "T3FREE", "THYROIDAB" in the last 72 hours.  Invalid input(s): "FREET3" Anemia work up No results for input(s): "VITAMINB12", "FOLATE", "FERRITIN", "TIBC", "IRON", "RETICCTPCT" in the last 72 hours. Urinalysis No results found for: "COLORURINE", "APPEARANCEUR", "LABSPEC", "PHURINE", "GLUCOSEU", "HGBUR", "BILIRUBINUR", "KETONESUR", "PROTEINUR", "UROBILINOGEN", "NITRITE", "LEUKOCYTESUR" Sepsis Labs Recent Labs  Lab 07/21/23 1211  WBC 7.5   Microbiology Recent Results (from the past 240 hours)  Resp panel by RT-PCR (RSV, Flu A&B, Covid) Anterior Nasal Swab     Status: Abnormal   Collection Time: 07/21/23  3:42 PM   Specimen: Anterior Nasal Swab  Result Value Ref Range Status   SARS Coronavirus 2 by RT PCR NEGATIVE NEGATIVE Final    Comment: (NOTE) SARS-CoV-2 target nucleic acids are NOT DETECTED.  The SARS-CoV-2 RNA is generally detectable in upper  respiratory specimens during the acute phase of infection. The lowest concentration of SARS-CoV-2 viral copies this assay can detect is 138 copies/mL. A negative result does not preclude SARS-Cov-2 infection and should not be used as the sole basis for treatment or other patient management decisions. A negative result may occur with  improper specimen collection/handling, submission of specimen other than nasopharyngeal swab, presence of viral mutation(s) within the areas targeted by this assay, and inadequate number of viral copies(<138 copies/mL). A negative result must be combined with clinical observations, patient history, and epidemiological information. The expected result is Negative.  Fact Sheet for Patients:  BloggerCourse.com  Fact Sheet for Healthcare Providers:  SeriousBroker.it  This test is no t yet approved or cleared by the [place] and  has been authorized  for detection and/or diagnosis of SARS-CoV-2 by FDA under an Emergency Use Authorization (EUA). This EUA will remain  in effect (meaning this test can be used) for the duration of the COVID-19 declaration under Section 564(b)(1) of the Act, 21 U.S.C.section 360bbb-3(b)(1), unless the authorization is terminated  or revoked sooner.       Influenza A by PCR POSITIVE (A) NEGATIVE Final   Influenza B by PCR NEGATIVE NEGATIVE Final    Comment: (NOTE) The Xpert Xpress SARS-CoV-2/FLU/RSV plus assay is intended as an aid in the diagnosis of influenza from Nasopharyngeal swab specimens and should not be used as a sole basis for treatment. Nasal washings and aspirates are unacceptable for Xpert Xpress SARS-CoV-2/FLU/RSV testing.  Fact Sheet for Patients: BloggerCourse.com  Fact Sheet for Healthcare Providers: SeriousBroker.it  This test is not yet approved or cleared by the [place] FDA and has been  authorized for detection and/or diagnosis of SARS-CoV-2 by FDA under an Emergency Use Authorization (EUA). This EUA will remain in effect (meaning this test can be used) for the duration of the COVID-19 declaration under Section 564(b)(1) of the Act, 21 U.S.C. section 360bbb-3(b)(1), unless the authorization is terminated or revoked.     Resp Syncytial Virus by PCR NEGATIVE NEGATIVE Final    Comment: (NOTE) Fact Sheet for Patients: BloggerCourse.com  Fact Sheet for Healthcare Providers: SeriousBroker.it  This test is not yet approved or cleared by the [place] FDA and has been authorized for detection and/or diagnosis of SARS-CoV-2 by FDA under an Emergency Use Authorization (EUA). This EUA will remain in effect (meaning this test can be used) for the duration of the COVID-19 declaration under Section 564(b)(1) of the Act, 21 U.S.C. section 360bbb-3(b)(1), unless the authorization is terminated or revoked.  Performed at [facility], [address], [place], [place] [address]    Imaging DG Chest 2 View Result Date: 07/21/2023 CLINICAL DATA:  Shortness of breath. Stopped smoking last month. Cold symptoms. EXAM: CHEST - 2 VIEW COMPARISON:  Chest radiographs 07/20/2023 in 01/18/2020, 01/13/2020 FINDINGS: Cardiac silhouette and mediastinal contours are within normal limits. Mild-to-moderate atherosclerotic calcification within the aortic arch. Flattening of the diaphragms and moderate hyperinflation. Increased attenuation of the superior pulmonary vasculature, chronic emphysematous changes. No acute airspace opacity is seen. No pleural effusion or pneumothorax. Mild multilevel degenerative disc changes of the thoracic spine. IMPRESSION: 1. No active cardiopulmonary disease. 2. Chronic emphysematous changes. 3. No significant change from yesterday's radiograph. Electronically Signed   By: Neita Garnet M.D.   On: 07/21/2023  15:51      Time coordinating discharge: over 30 minutes  SIGNED:  Sunnie Nielsen DO [facility]

## 2023-07-22 NOTE — Plan of Care (Signed)

## 2023-07-22 NOTE — Progress Notes (Signed)
PHARMACIST - PHYSICIAN COMMUNICATION  CONCERNING:  Enoxaparin (Lovenox) for DVT Prophylaxis    RECOMMENDATION: Patient was prescribed enoxaprin 40mg  q24 hours for VTE prophylaxis.   Filed Weights   07/21/23 1210  Weight: 43.1 kg (95 lb)    Body mass index is 16.31 kg/m.  Estimated Creatinine Clearance: 45.8 mL/min (by C-G formula based on SCr of 0.69 mg/dL).   Patient is candidate for enoxaparin 30mg  every 24 hours based on Weight <45kg  DESCRIPTION: Pharmacy has adjusted enoxaparin dose per [facility] policy.  Patient is now receiving enoxaparin 30 mg every 24 hours    Gardner Candle, PharmD, BCPS Clinical Pharmacist 07/22/2023 1:24 PM

## 2023-07-23 LAB — BLOOD GAS, VENOUS
Bicarbonate: 36.5 mmol/L — ABNORMAL HIGH (ref 20.0–28.0)
O2 Saturation: 53 mmol/L — ABNORMAL HIGH (ref 0.0–2.0)
Patient temperature: 37
Patient temperature: 53 %
pCO2, Ven: 59 mm[Hg] (ref 44–60)
pH, Ven: 7.4 (ref 7.25–7.43)
pO2, Ven: <36.5 mmol/L — CL (ref 32–45)

## 2023-10-05 ENCOUNTER — Other Ambulatory Visit: Payer: Self-pay | Admitting: Family Medicine

## 2023-10-05 NOTE — Telephone Encounter (Signed)
 Copied from CRM [id number]. Topic: Clinical - Medication Refill >> Oct 05, 2023 12:32 PM Patsy Lager T wrote: Most Recent Primary Care Visit:  Provider: Duanne Limerick  Department: [facility]  Visit Type: OFFICE VISIT  Date: 07/21/2023  Medication: albuterol (VENTOLIN HFA) 108 (90 Base) MCG/ACT inhaler  Has the patient contacted their pharmacy? No  Is this the correct pharmacy for this prescription? Yes If no, delete pharmacy and type the correct one.  This is the patient's preferred pharmacy:  [facility] [phone], [place] - [address] [place] [address] Phone: [phone] Fax: [phone]   Has the prescription been filled recently? Yes  Is the patient out of the medication? No  Has the patient been seen for an appointment in the last year OR does the patient have an upcoming appointment? Yes  Can we respond through MyChart? No  Agent: Please be advised that Rx refills may take up to 3 business days. We ask that you follow-up with your pharmacy.

## 2023-10-06 MED ORDER — ALBUTEROL SULFATE HFA 108 (90 BASE) MCG/ACT IN AERS: 1.0000 | INHALATION_SPRAY | RESPIRATORY_TRACT | 1 refills | Status: DC | PRN

## 2023-10-06 NOTE — Telephone Encounter (Signed)
 Requested Prescriptions  Pending Prescriptions Disp Refills   albuterol (VENTOLIN HFA) 108 (90 Base) MCG/ACT inhaler 8 g 1    Sig: Inhale 1-2 puffs into the lungs every 4 (four) hours as needed for wheezing or shortness of breath.     Pulmonology:  Beta Agonists 2 Failed - 10/06/2023 10:52 AM      Failed - Last BP in normal range    BP Readings from Last 1 Encounters:  07/22/23 (!) 140/91         Failed - Valid encounter within last 12 months    Recent Outpatient Visits   None            Passed - Last Heart Rate in normal range    Pulse Readings from Last 1 Encounters:  07/22/23 71

## 2023-11-23 ENCOUNTER — Other Ambulatory Visit: Payer: Self-pay | Admitting: Family Medicine

## 2024-01-15 ENCOUNTER — Other Ambulatory Visit: Payer: Self-pay | Admitting: Student

## 2024-01-15 NOTE — Telephone Encounter (Unsigned)
 Copied from CRM [id number]. Topic: Clinical - Medication Refill >> Jan 15, 2024  5:09 PM Corin V wrote: Medication: albuterol  (VENTOLIN  HFA) 108 (90 Base) MCG/ACT inhaler- Patient was seeing Dr. Joshua  Has the patient contacted their pharmacy? Yes (Agent: If no, request that the patient contact the pharmacy for the refill. If patient does not wish to contact the pharmacy document the reason why and proceed with request.) (Agent: If yes, when and what did the pharmacy advise?)  This is the patient's preferred pharmacy:  [phone], [place] - [address] [place] [address] Phone: [phone] Fax: [phone]  Is this the correct pharmacy for this prescription? Yes If no, delete pharmacy and type the correct one.   Has the prescription been filled recently? No  Is the patient out of the medication? No  Has the patient been seen for an appointment in the last year OR does the patient have an upcoming appointment? Yes  Can we respond through MyChart? No  Agent: Please be advised that Rx refills may take up to 3 business days. We ask that you follow-up with your pharmacy.

## 2024-01-16 ENCOUNTER — Telehealth: Payer: Self-pay

## 2024-01-16 NOTE — Telephone Encounter (Unsigned)
 Copied from CRM [id number]. Topic: Appointments - Transfer of Care >> Jan 15, 2024  5:15 PM Corin V wrote: Pt is requesting to transfer FROM: Dr. Joshua Pt is requesting to transfer TO: Dr. Lemon Reason for requested transfer: Dr. Joshua retired- Patient does not feel like she needs to be seen and wanted to just scheudle her 1 year follow up as her TOC apt It is the responsibility of the team the patient would like to transfer to (Dr. Lemon) to reach out to the patient if for any reason this transfer is not acceptable.

## 2024-01-17 NOTE — Telephone Encounter (Signed)
 Requested medication (s) are due for refill today:   Yes  Requested medication (s) are on the active medication list:   Yes  Future visit scheduled:   Yes 07/17/2024 with Dr. Lemon.   Former pt of Dr. Joshua LOV 07/21/2023   Last ordered: 11/24/2023 6.7 each, 1 refill   Returned because not seen by new provider taking Dr. Joshua' place.   See pt note in chart regarding transferring care.     Requested Prescriptions  Pending Prescriptions Disp Refills   albuterol  (VENTOLIN  HFA) 108 (90 Base) MCG/ACT inhaler 6.7 each 1     Pulmonology:  Beta Agonists 2 Failed - 01/17/2024  3:52 PM      Failed - Last BP in normal range    BP Readings from Last 1 Encounters:  07/22/23 (!) 140/91         Failed - Valid encounter within last 12 months    Recent Outpatient Visits   None            Passed - Last Heart Rate in normal range    Pulse Readings from Last 1 Encounters:  07/22/23 71

## 2024-01-22 ENCOUNTER — Other Ambulatory Visit: Payer: Self-pay | Admitting: Family Medicine

## 2024-01-22 NOTE — Telephone Encounter (Signed)
 Copied from CRM [id number]. Topic: Clinical - Medication Refill >> Jan 15, 2024  5:09 PM Corin V wrote: Medication: albuterol  (VENTOLIN  HFA) 108 (90 Base) MCG/ACT inhaler- Patient was seeing Dr. Joshua  Has the patient contacted their pharmacy? Yes (Agent: If no, request that the patient contact the pharmacy for the refill. If patient does not wish to contact the pharmacy document the reason why and proceed with request.) (Agent: If yes, when and what did the pharmacy advise?)  This is the patient's preferred pharmacy:  [phone], [place] - [address] [place] [address] Phone: [phone] Fax: [phone]  Is this the correct pharmacy for this prescription? Yes If no, delete pharmacy and type the correct one.   Has the prescription been filled recently? No  Is the patient out of the medication? No  Has the patient been seen for an appointment in the last year OR does the patient have an upcoming appointment? Yes  Can we respond through MyChart? No  Agent: Please be advised that Rx refills may take up to 3 business days. We ask that you follow-up with your pharmacy. >> Jan 22, 2024  2:29 PM Shardie S wrote: Patient calling to check the status of refill. Patient states she is out of this medication. Patient requesting a callback from nurse.

## 2024-01-24 ENCOUNTER — Other Ambulatory Visit: Payer: Self-pay | Admitting: Student

## 2024-01-24 NOTE — Telephone Encounter (Unsigned)
 Copied from CRM [id number]. Topic: Clinical - Medication Refill >> Jan 24, 2024  8:22 AM Precious C wrote: Medication:  albuterol  (VENTOLIN  HFA) 108 (90 Base) MCG/ACT inhaler   Has the patient contacted their pharmacy? Yes, advise that they have not received an RX refill request (Agent: If yes, when and what did the pharmacy advise?)  This is the patient's preferred pharmacy:  [phone], [place] - [address] [place] [address] Phone: [phone] Fax: [phone]  Is this the correct pharmacy for this prescription? Yes If no, delete pharmacy and type the correct one.   Has the prescription been filled recently? No  Is the patient out of the medication? Yes  Has the patient been seen for an appointment in the last year OR does the patient have an upcoming appointment? No  Can we respond through MyChart? No  Agent: Please be advised that Rx refills may take up to 3 business days. We ask that you follow-up with your pharmacy.

## 2024-01-25 NOTE — Telephone Encounter (Signed)
 Requested medications are due for refill today.  yes  Requested medications are on the active medications list.  yes  Last refill. 11/24/2023 6.7 1 rf  Future visit scheduled.   Yes - with new provider.  Notes to clinic.  Pt has not yet seen new provider. Dr. Joshua listed as PCP.    Requested Prescriptions  Pending Prescriptions Disp Refills   albuterol  (VENTOLIN  HFA) 108 (90 Base) MCG/ACT inhaler 6.7 each 1     Pulmonology:  Beta Agonists 2 Failed - 01/25/2024  9:14 AM      Failed - Last BP in normal range    BP Readings from Last 1 Encounters:  07/22/23 (!) 140/91         Failed - Valid encounter within last 12 months    Recent Outpatient Visits   None            Passed - Last Heart Rate in normal range    Pulse Readings from Last 1 Encounters:  07/22/23 71

## 2024-01-26 ENCOUNTER — Telehealth: Admitting: Nurse Practitioner

## 2024-01-26 DIAGNOSIS — J4521 Mild intermittent asthma with (acute) exacerbation: Secondary | ICD-10-CM

## 2024-01-26 NOTE — Telephone Encounter (Unsigned)
 Copied from CRM [id number]. Topic: Clinical - Red Word Triage >> Jan 26, 2024  4:13 PM Tobias L wrote: Red Word that prompted transfer to Nurse Triage: pt has COPD, patient currently has had some trouble breathing. Patient states she needs her albuterol  inhaler refilled.   Spoke to [facility] & informed they would not be able to refill prescription until patient is able to be seen. Advised to ask patient to schedule sooner appointment to be able to get refill.   Patient declined to speak to NT. Patient upset she is not able to get refill until she is seen, as patient is not able to come in due to work. Patient upset and would like to speak to provider directly.

## 2024-01-26 NOTE — Telephone Encounter (Signed)
 Patient is to schedule TOC office visit within 30 days to obtain refill on medications.

## 2024-01-26 NOTE — Telephone Encounter (Signed)
 Pt called to report that she is upset that she never received a call back from the clinic like she requested. She says would like to speak to someone from the clinic today. Says she travels for work and cannot come into the office until January. Says this is for her breathing and she needs this urgently.

## 2024-01-27 ENCOUNTER — Other Ambulatory Visit: Payer: Self-pay | Admitting: Nurse Practitioner

## 2024-01-27 ENCOUNTER — Encounter: Admitting: Nurse Practitioner

## 2024-01-27 DIAGNOSIS — J438 Other emphysema: Secondary | ICD-10-CM

## 2024-01-27 MED ORDER — ALBUTEROL SULFATE HFA 108 (90 BASE) MCG/ACT IN AERS: 1.0000 | INHALATION_SPRAY | RESPIRATORY_TRACT | 0 refills | Status: DC | PRN

## 2024-01-27 MED ORDER — PREDNISONE 20 MG PO TABS: 40.0000 mg | ORAL_TABLET | Freq: Every day | ORAL | 0 refills | Status: AC

## 2024-01-27 NOTE — Progress Notes (Signed)
 I have spent 5 minutes in review of e-visit questionnaire, review and updating patient chart, medical decision making and response to patient.   Claiborne Rigg, NP

## 2024-01-27 NOTE — Progress Notes (Signed)

## 2024-02-03 NOTE — Progress Notes (Signed)
Duplicate evisit 

## 2024-02-22 ENCOUNTER — Telehealth: Admitting: Nurse Practitioner

## 2024-02-22 ENCOUNTER — Telehealth: Admitting: Physician Assistant

## 2024-02-22 ENCOUNTER — Encounter

## 2024-02-22 DIAGNOSIS — J45901 Unspecified asthma with (acute) exacerbation: Secondary | ICD-10-CM

## 2024-02-22 DIAGNOSIS — J4520 Mild intermittent asthma, uncomplicated: Secondary | ICD-10-CM

## 2024-02-22 NOTE — Progress Notes (Signed)
  Because you were already evaluated and treated for suspected asthma exacerbation via e-visit this month, I feel your condition warrants further evaluation and I recommend that you be seen in a face-to-face visit.   NOTE: There will be NO CHARGE for this E-Visit   If you are having a true medical emergency, please call 911.     For an urgent face to face visit, [facility] has multiple urgent care centers for your convenience.  Click the link below for the full list of locations and hours, walk-in wait times, appointment scheduling options and driving directions:  Urgent Care - [place], [place], [place], [place], [place], [place]  [place]     Your MyChart E-visit questionnaire answers were reviewed by a board certified advanced clinical practitioner to complete your personal care plan based on your specific symptoms.    Thank you for using e-Visits.

## 2024-02-22 NOTE — Progress Notes (Signed)
   Thank you for the details you included in the comment boxes. Those details are very helpful in determining the best course of treatment for you and help us  to provide the best care.Because you are requesting a chronic medication refill, we recommend that you schedule a Virtual Urgent Care video visit in order for the provider to better assess what is going on.  The provider will be able to give you a more accurate diagnosis and treatment plan if we can more freely discuss your symptoms and with the addition of a virtual examination.   If you change your visit to a video visit, we will bill your insurance (similar to an office visit) and you will not be charged for this e-Visit. You will be able to stay at home and speak with the first available [facility] advanced practice provider. The link to do a video visit is in the drop down Menu tab of your Welcome screen in MyChart.       I have spent 5 minutes in review of e-visit questionnaire, review and updating patient chart, medical decision making and response to patient.   Delon CHRISTELLA Dickinson, PA-C

## 2024-02-24 ENCOUNTER — Telehealth: Admitting: Emergency Medicine

## 2024-02-24 DIAGNOSIS — J4521 Mild intermittent asthma with (acute) exacerbation: Secondary | ICD-10-CM | POA: Diagnosis not present

## 2024-02-24 MED ORDER — ALBUTEROL SULFATE HFA 108 (90 BASE) MCG/ACT IN AERS: 1.0000 | INHALATION_SPRAY | RESPIRATORY_TRACT | 0 refills | Status: DC | PRN

## 2024-02-24 NOTE — Progress Notes (Signed)
 E-Visit for Asthma  Thank you for answering my additional questions. I'm not seeing much flu in the community just yet - you could have the flu but I think a different kind of virus like a cold or COVID is more likely. I do recommend you test yourself for covid at home. You could also get a combination flu and covid test to make sure you do not have the flu.   If you are very short of breath, and the inhaler is not helping, please be seen in person. If your symptoms are severe, go to the ER.    Based on what you have shared with me, it looks like you may have a flare up of your asthma.  Asthma is a chronic (ongoing) lung disease which results in airway obstruction, inflammation and hyper-responsiveness.   Asthma symptoms vary from person to person, with common symptoms including nighttime awakening and decreased ability to participate in normal activities as a result of shortness of breath. It is often triggered by changes in weather, changes in the season, changes in air temperature, or inside (home, school, daycare or work) allergens such as animal dander, mold, mildew, woodstoves or cockroaches.   It can also be triggered by hormonal changes, extreme emotion, physical exertion or an upper respiratory tract illness.     It is important to identify the trigger, and then eliminate or avoid the trigger if possible.   If you have been prescribed medications to be taken on a regular basis, it is important to follow the asthma action plan and to follow guidelines to adjust medication in response to increasing symptoms of decreased peak expiratory flow rate  Treatment: I have prescribed: Albuterol  (Proventil  HFA; Ventolin  HFA) 108 (90 Base) MCG/ACT Inhaler 2 puffs into the lungs every six hours as needed for wheezing or shortness of breath  HOME CARE Only take medications as instructed by  your medical team. Consider wearing a mask or scarf to improve breathing air temperature have been shown to decrease irritation and decrease exacerbations Get rest. Taking a steamy shower or using a humidifier may help nasal congestion sand ease sore throat pain. You can place a towel over your head and breathe in the steam from hot water coming from a faucet. Using a saline nasal spray works much the same way.  Cough drops, hare candies and sore throat lozenges may ease your cough.  Avoid close contacts especially the very you and the elderly Cover your mouth if you cough or sneeze Always remember to wash your hands.    GET HELP RIGHT AWAY IF: You develop worsening symptoms; breathlessness at rest, drowsy, confused or agitated, unable to speak in full sentences You have coughing fits You develop a severe headache or visual changes You develop shortness of breath, difficulty breathing or start having chest pain Your symptoms persist after you have completed your treatment plan If your symptoms do not improve within 10 days  MAKE SURE YOU Understand these instructions. Will watch your condition. Will get help right away if you are not doing well or get worse.   Your e-visit answers were reviewed by a board certified advanced clinical practitioner to complete your personal care plan, Depending upon the condition, your plan could have included both over the counter or prescription medications.   Please review your pharmacy choice. Your safety is important to us . If you have drug allergies check your prescription carefully.  You can use MyChart to ask questions about today's visit, request a  non-urgent  call back, or ask for a work or school excuse for 24 hours related to this e-Visit. If it has been greater than 24 hours you will need to follow up with your provider, or enter a new e-Visit to address those concerns.   You will get an e-mail in the next two days asking about your  experience. I hope that your e-visit has been valuable and will speed your recovery. Thank you for using e-visits.  I have spent 5 minutes in review of e-visit questionnaire, review and updating patient chart, medical decision making and response to patient.   Jon Belt, PhD, FNP-BC

## 2024-03-30 ENCOUNTER — Telehealth: Admitting: Nurse Practitioner

## 2024-03-30 DIAGNOSIS — J069 Acute upper respiratory infection, unspecified: Secondary | ICD-10-CM

## 2024-03-30 MED ORDER — IPRATROPIUM BROMIDE 0.03 % NA SOLN: 2.0000 | Freq: Two times a day (BID) | NASAL | 0 refills | Status: DC

## 2024-03-30 NOTE — Progress Notes (Signed)
 We are sorry you are not feeling well.  Here is how we plan to help!  Based on what you have shared with me, it looks like you may have a viral upper respiratory infection.  Upper respiratory infections are caused by a large number of viruses; however, rhinovirus is the most common cause.   Symptoms vary from person to person, with common symptoms including sore throat, cough, and fatigue or lack of energy.  A low-grade fever of up to 100.4 may present, but is often uncommon.  Symptoms vary however, and are closely related to a person's age or underlying illnesses.  The most common symptoms associated with an upper respiratory infection are nasal discharge or congestion, cough, sneezing, headache and pressure in the ears and face.  These symptoms usually persist for about 3 to 10 days, but can last up to 2 weeks.  It is important to know that upper respiratory infections do not cause serious illness or complications in most cases.    Upper respiratory infections can be transmitted from person to person, with the most common method of transmission being a person's hands.  The virus is able to live on the skin and can infect other persons for up to 2 hours after direct contact.  Also, these can be transmitted when someone coughs or sneezes; thus, it is important to cover the mouth to reduce this risk.  To keep the spread of the illness at bay, good hand hygiene is very important.  This is an infection that is most likely caused by a virus. There are no specific treatments other than to help you with the symptoms until the infection runs its course.  We are sorry you are not feeling well.  Here is how we plan to help!   For nasal congestion, you may use an oral decongestants such as Mucinex  D or if you have glaucoma or high blood pressure use plain Mucinex .  Saline nasal spray or nasal drops can help and can safely be used as often as needed for congestion.  For your runny nose and ear pain  I have  prescribed Ipratropium Bromide nasal spray 0.03% two sprays in each nostril 2-3 times a day  If you do not have a history of heart disease, hypertension, diabetes or thyroid  disease, prostate/bladder issues or glaucoma, you may also use Sudafed to treat nasal congestion.  It is highly recommended that you consult with a pharmacist or your primary care physician to ensure this medication is safe for you to take.     If you have a cough, you may use cough suppressants such as Delsym and Robitussin.  If you have glaucoma or high blood pressure, you can also use Coricidin HBP.    If you have a sore or scratchy throat, use a saltwater gargle-  to  teaspoon of salt dissolved in a 4-ounce to 8-ounce glass of warm water.  Gargle the solution for approximately 15-30 seconds and then spit.  It is important not to swallow the solution.  You can also use throat lozenges/cough drops and Chloraseptic spray to help with throat pain or discomfort.  Warm or cold liquids can also be helpful in relieving throat pain.  For headache, pain or general discomfort, you can use Ibuprofen or Tylenol  as directed.   Some authorities believe that zinc sprays or the use of Echinacea may shorten the course of your symptoms.   HOME CARE Only take medications as instructed by your medical team. Be sure to  drink plenty of fluids. Water is fine as well as fruit juices, sodas and electrolyte beverages. You may want to stay away from caffeine or alcohol. If you are nauseated, try taking small sips of liquids. How do you know if you are getting enough fluid? Your urine should be a pale yellow or almost colorless. Get rest. Taking a steamy shower or using a humidifier may help nasal congestion and ease sore throat pain. You can place a towel over your head and breathe in the steam from hot water coming from a faucet. Using a saline nasal spray works much the same way. Cough drops, hard candies and sore throat lozenges may ease your  cough. Avoid close contacts especially the very young and the elderly Cover your mouth if you cough or sneeze Always remember to wash your hands.   GET HELP RIGHT AWAY IF: You develop worsening fever. If your symptoms do not improve within 10 days You develop yellow or green discharge from your nose over 3 days. You have coughing fits You develop a severe head ache or visual changes. You develop shortness of breath, difficulty breathing or start having chest pain Your symptoms persist after you have completed your treatment plan  MAKE SURE YOU  Understand these instructions. Will watch your condition. Will get help right away if you are not doing well or get worse.  Your e-visit answers were reviewed by a board certified advanced clinical practitioner to complete your personal care plan. Depending upon the condition, your plan could have included both over the counter or prescription medications. Please review your pharmacy choice. If there is a problem, you may call our nursing hot line at and have the prescription routed to another pharmacy. Your safety is important to us . If you have drug allergies check your prescription carefully.   You can use MyChart to ask questions about today's visit, request a non-urgent call back, or ask for a work or school excuse for 24 hours related to this e-Visit. If it has been greater than 24 hours you will need to follow up with your provider, or enter a new e-Visit to address those concerns. You will get an e-mail in the next two days asking about your experience.  I hope that your e-visit has been valuable and will speed your recovery. Thank you for using e-visits.   I have spent 5 minutes in review of e-visit questionnaire, review and updating patient chart, medical decision making and response to patient.   Lisa Kaylor W Astaria Nanez, NP

## 2024-04-01 ENCOUNTER — Telehealth: Admitting: Physician Assistant

## 2024-04-01 DIAGNOSIS — J208 Acute bronchitis due to other specified organisms: Secondary | ICD-10-CM

## 2024-04-01 MED ORDER — PREDNISONE 20 MG PO TABS: 40.0000 mg | ORAL_TABLET | Freq: Every day | ORAL | 0 refills | Status: DC

## 2024-04-01 MED ORDER — ALBUTEROL SULFATE HFA 108 (90 BASE) MCG/ACT IN AERS: 1.0000 | INHALATION_SPRAY | Freq: Four times a day (QID) | RESPIRATORY_TRACT | 0 refills | Status: AC | PRN

## 2024-04-01 MED ORDER — BENZONATATE 100 MG PO CAPS: 100.0000 mg | ORAL_CAPSULE | Freq: Three times a day (TID) | ORAL | 0 refills | Status: DC | PRN

## 2024-04-01 NOTE — Progress Notes (Signed)
 We are sorry that you are not feeling well.  Here is how we plan to help!  Based on your presentation I believe you most likely have A cough due to a virus.  This is called viral bronchitis and is best treated by rest, plenty of fluids and control of the cough.  You may use Ibuprofen or Tylenol  as directed to help your symptoms.     In addition you may use A non-prescription cough medication called Mucinex  DM: take 2 tablets every 12 hours. and A prescription cough medication called Tessalon  Perles 100mg . You may take 1-2 capsules every 8 hours as needed for your cough.  I have also prescribed Prednisone  20mg  Take 2 tablets (40mg ) daily for 5 days.  I have also prescribed Albuterol  inhaler Use 1-2 puffs every 6 hours as needed for shortness of breath, chest tightness, and/or wheezing.  From your responses in the eVisit questionnaire you describe inflammation in the upper respiratory tract which is causing a significant cough.  This is commonly called Bronchitis and has four common causes:   Allergies Viral Infections Acid Reflux Bacterial Infection Allergies, viruses and acid reflux are treated by controlling symptoms or eliminating the cause. An example might be a cough caused by taking certain blood pressure medications. You stop the cough by changing the medication. Another example might be a cough caused by acid reflux. Controlling the reflux helps control the cough.  USE OF BRONCHODILATOR (RESCUE) INHALERS: There is a risk from using your bronchodilator too frequently.  The risk is that over-reliance on a medication which only relaxes the muscles surrounding the breathing tubes can reduce the effectiveness of medications prescribed to reduce swelling and congestion of the tubes themselves.  Although you feel brief relief from the bronchodilator inhaler, your asthma may actually be worsening with the tubes becoming more swollen and filled with mucus.  This can delay other crucial treatments,  such as oral steroid medications. If you need to use a bronchodilator inhaler daily, several times per day, you should discuss this with your provider.  There are probably better treatments that could be used to keep your asthma under control.     HOME CARE Only take medications as instructed by your medical team. Complete the entire course of an antibiotic. Drink plenty of fluids and get plenty of rest. Avoid close contacts especially the very young and the elderly Cover your mouth if you cough or cough into your sleeve. Always remember to wash your hands A steam or ultrasonic humidifier can help congestion.   GET HELP RIGHT AWAY IF: You develop worsening fever. You become short of breath You cough up blood. Your symptoms persist after you have completed your treatment plan MAKE SURE YOU  Understand these instructions. Will watch your condition. Will get help right away if you are not doing well or get worse.  Your e-visit answers were reviewed by a board certified advanced clinical practitioner to complete your personal care plan.  Depending on the condition, your plan could have included both over the counter or prescription medications. If there is a problem please reply  once you have received a response from your provider. Your safety is important to us .  If you have drug allergies check your prescription carefully.    You can use MyChart to ask questions about today's visit, request a non-urgent call back, or ask for a work or school excuse for 24 hours related to this e-Visit. If it has been greater than 24 hours you  will need to follow up with your provider, or enter a new e-Visit to address those concerns. You will get an e-mail in the next two days asking about your experience.  I hope that your e-visit has been valuable and will speed your recovery. Thank you for using e-visits.   I have spent 5 minutes in review of e-visit questionnaire, review and updating patient chart,  medical decision making and response to patient.   Delon CHRISTELLA Dickinson, PA-C

## 2024-04-04 ENCOUNTER — Telehealth: Admitting: Physician Assistant

## 2024-04-04 DIAGNOSIS — B9689 Other specified bacterial agents as the cause of diseases classified elsewhere: Secondary | ICD-10-CM

## 2024-04-04 MED ORDER — DOXYCYCLINE HYCLATE 100 MG PO TABS: 100.0000 mg | ORAL_TABLET | Freq: Two times a day (BID) | ORAL | 0 refills | Status: DC

## 2024-04-04 NOTE — Progress Notes (Signed)
 We are sorry that you are not feeling well.  Here is how we plan to help!  Based on your presentation I believe you most likely have A cough due to bacteria.  When patients have a fever and a productive cough with a change in color or increased sputum production, we are concerned about bacterial bronchitis.  If left untreated it can progress to pneumonia.  If your symptoms do not improve with your treatment plan it is important that you contact your provider.   I have prescribed Doxycycline  100 mg twice a day for 7 days     In addition you may use A non-prescription cough medication called Mucinex  DM: take 2 tablets every 12 hours.. Continue the previously prescribed medications.  From your responses in the eVisit questionnaire you describe inflammation in the upper respiratory tract which is causing a significant cough.  This is commonly called Bronchitis and has four common causes:   Allergies Viral Infections Acid Reflux Bacterial Infection Allergies, viruses and acid reflux are treated by controlling symptoms or eliminating the cause. An example might be a cough caused by taking certain blood pressure medications. You stop the cough by changing the medication. Another example might be a cough caused by acid reflux. Controlling the reflux helps control the cough.  USE OF BRONCHODILATOR (RESCUE) INHALERS: There is a risk from using your bronchodilator too frequently.  The risk is that over-reliance on a medication which only relaxes the muscles surrounding the breathing tubes can reduce the effectiveness of medications prescribed to reduce swelling and congestion of the tubes themselves.  Although you feel brief relief from the bronchodilator inhaler, your asthma may actually be worsening with the tubes becoming more swollen and filled with mucus.  This can delay other crucial treatments, such as oral steroid medications. If you need to use a bronchodilator inhaler daily, several times per day, you  should discuss this with your provider.  There are probably better treatments that could be used to keep your asthma under control.     HOME CARE Only take medications as instructed by your medical team. Complete the entire course of an antibiotic. Drink plenty of fluids and get plenty of rest. Avoid close contacts especially the very young and the elderly Cover your mouth if you cough or cough into your sleeve. Always remember to wash your hands A steam or ultrasonic humidifier can help congestion.   GET HELP RIGHT AWAY IF: You develop worsening fever. You become short of breath You cough up blood. Your symptoms persist after you have completed your treatment plan MAKE SURE YOU  Understand these instructions. Will watch your condition. Will get help right away if you are not doing well or get worse.  Your e-visit answers were reviewed by a board certified advanced clinical practitioner to complete your personal care plan.  Depending on the condition, your plan could have included both over the counter or prescription medications. If there is a problem please reply  once you have received a response from your provider. Your safety is important to us .  If you have drug allergies check your prescription carefully.    You can use MyChart to ask questions about today's visit, request a non-urgent call back, or ask for a work or school excuse for 24 hours related to this e-Visit. If it has been greater than 24 hours you will need to follow up with your provider, or enter a new e-Visit to address those concerns. You will get an e-mail  in the next two days asking about your experience.  I hope that your e-visit has been valuable and will speed your recovery. Thank you for using e-visits.   I have spent 5 minutes in review of e-visit questionnaire, review and updating patient chart, medical decision making and response to patient.   Elsie Velma Lunger, PA-C

## 2024-04-11 ENCOUNTER — Telehealth: Payer: Self-pay

## 2024-04-11 ENCOUNTER — Ambulatory Visit: Payer: Self-pay

## 2024-04-11 NOTE — Telephone Encounter (Signed)
 FYI Only or Action Required?: FYI only for provider.  Patient was last seen in primary care on 01/27/2024 by Theotis Haze ORN, NP.  Called Nurse Triage reporting Cough.  Symptoms began several weeks ago.  Interventions attempted: Prescription medications: nasal spray, prednisone , doxycycline .  Symptoms are: unchanged.  Triage Disposition: See PCP When Office is Open (Within 3 Days)  Patient/caregiver understands and will follow disposition?: Yes        Copied from CRM #[id number]. Topic: Clinical - Red Word Triage >> Apr 11, 2024 11:46 AM Treva T wrote: [facility] that prompted transfer to Nurse Triage: Patient calling, states she has worsening productive cough, starting to wheeze, and having difficulty breathing, with extremely thick white mucous.   Patient was prescribed medications previously of Prednisone  and antibiotic, and not helping. Reason for Disposition  [1] Nasal discharge AND [2] present > 10 days  Answer Assessment - Initial Assessment Questions Pt states that she saw a telehealth doctor online and they prescribed her prednisone , nasal spray and doxycycline . She states it is still lingering. She states initially she had green/brownish sputum. No its clear or thick white. Cough seems around the throat area.     1. ONSET: When did the cough begin?      About 10 days ago 2. SEVERITY: How bad is the cough today?      Cough is getting better at night but still during the day 3. SPUTUM: Describe the color of your sputum (e.g., none, dry cough; clear, white, yellow, green)     Started it out as greenish brown and now thick white 4. HEMOPTYSIS: Are you coughing up any blood? If Yes, ask: How much? (e.g., flecks, streaks, tablespoons, etc.)     no 5. DIFFICULTY BREATHING: Are you having difficulty breathing? If Yes, ask: How bad is it? (e.g., mild, moderate, severe)      When coughing a lot to get stuff up feels winded 6. FEVER: Do you have a fever? If Yes,  ask: What is your temperature, how was it measured, and when did it start?     no  10. OTHER SYMPTOMS: Do you have any other symptoms? (e.g., runny nose, wheezing, chest pain)       Runny nose, stuffy , slight wheezing, better now.  Protocols used: Cough - Acute Productive-A-AH

## 2024-04-11 NOTE — Telephone Encounter (Signed)
 Copied from CRM [id number]. Topic: Appointments - Appointment Info/Confirmation >> Apr 11, 2024  1:17 PM Delon DASEN wrote: Patient is upset with what the nurse told her over the phone about not being able to do anything for her, she has finished the medications from the telehealth and it has been 10 days with no improvement- [phone]

## 2024-04-11 NOTE — Telephone Encounter (Signed)
 Patient called back about appt. This is the response from patient.

## 2024-04-11 NOTE — Telephone Encounter (Signed)
 Noted  Pt has appt.  KP

## 2024-04-12 ENCOUNTER — Ambulatory Visit: Admitting: Internal Medicine

## 2024-04-12 NOTE — Telephone Encounter (Signed)
 Patient declined appt today per office manager. She said she will come in nest week for her TOC.  - Lisa Goodwin M.

## 2024-04-17 ENCOUNTER — Ambulatory Visit: Admitting: Student

## 2024-04-17 ENCOUNTER — Encounter: Payer: Self-pay | Admitting: Student

## 2024-04-17 VITALS — BP 126/80 | HR 93 | Temp 98.2°F | Ht 64.0 in | Wt 103.0 lb

## 2024-04-17 DIAGNOSIS — J0100 Acute maxillary sinusitis, unspecified: Secondary | ICD-10-CM

## 2024-04-17 DIAGNOSIS — J439 Emphysema, unspecified: Secondary | ICD-10-CM

## 2024-04-17 DIAGNOSIS — J329 Chronic sinusitis, unspecified: Secondary | ICD-10-CM | POA: Insufficient documentation

## 2024-04-17 MED ORDER — BREZTRI AEROSPHERE 160-9-4.8 MCG/ACT IN AERO: 2.0000 | INHALATION_SPRAY | Freq: Two times a day (BID) | RESPIRATORY_TRACT | 11 refills | Status: AC

## 2024-04-17 NOTE — Assessment & Plan Note (Signed)
 Continue to have rhinorrhea with post nasal drip. She will try flonase and nasal irrigation to see if this improves sx. Possible allergic component given exposure to hay and dust at work, works at a [employer]. However has not had difficulties with allergies in the past.

## 2024-04-17 NOTE — Progress Notes (Signed)
 Established Patient Office Visit  Subjective   Patient ID: Lisa Goodwin, female    DOB: 09/23/1955  Age: 68 y.o. MRN: [id number]  Chief Complaint  Patient presents with   Cough     Comes and goes, x 3 weeks, feels SOB at times, chest congestion, phlegm thick white film, nasal drainage, albuterol  inhaler is helping but does not want to rely on it everyday     Will LULLA Barge with medical hx listed below presents today persistent cough and nasal drainage. Symptoms started around 10/1 with nasal congestion, sneezing, and rhinorrhea. Progress to productive cough and wheezing. Has had multiple E-vitis. Treated for URI with ipratropium nasal spray, prednisone , tessalon  perles, mucinex , and albuterol . Given persistent symptoms prescribed a 7 day course of doxycycline  that she completed last week.  Reports she has been exposed to coworkers who have been sick lately.   Since then energy is improving however still have intermittent cough with white sputum, runny nose and congestion. She describes tickling sensation in the back of the throat that makes her cough. Is a former smoker stopped in January after admission for influenza A with likely COPD exacerbation. She is using albuterol  about twice a day still.  Reports she is exposed to hay, dust, and dander at work however has not had a hx of allergies. She denies fevers chills, dyspnea, wheezing, hemoptysis, n/v/d, dizziness, or weakness.   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   budesonide -glycopyrrolate-formoterol (BREZTRI AEROSPHERE) 160-9-4.8  MCG/ACT AERO inhaler Inhale 2 puffs into the lungs 2 (two) times daily.   ipratropium (ATROVENT) 0.03 % nasal spray Place 2 sprays into both nostrils every 12 (twelve) hours. (Patient not taking: Reported on 04/17/2024)   [DISCONTINUED] benzonatate  (TESSALON ) 100 MG capsule Take 1 capsule (100 mg total) by mouth 3 (three) times daily as needed. (Patient not taking: Reported on 04/17/2024)   [DISCONTINUED] doxycycline  (VIBRA -TABS) 100 MG tablet Take 1 tablet (100 mg total) by mouth 2 (two) times daily. (Patient not taking: Reported on 04/17/2024)   [DISCONTINUED] predniSONE  (DELTASONE ) 20 MG tablet Take 2 tablets (40 mg total) by mouth daily with breakfast. (Patient not taking: Reported on 04/17/2024)   No facility-administered encounter medications on file as of 04/17/2024.    BP 126/80   Pulse 93   Temp 98.2 F (36.8 C) (Oral)   Ht 5' 4 (1.626 m)   Wt 103 lb (46.7 kg)   SpO2 95%   BMI 17.68 kg/m  BP Readings from Last 3 Encounters:  04/17/24 126/80  07/22/23 (!) 140/91  07/21/23 112/74    Physical Exam Constitutional:      Appearance: Normal appearance.  HENT:     Nose: Congestion and rhinorrhea present.     Right Turbinates: Enlarged and swollen.     Left Turbinates: Enlarged and swollen.     Right Sinus: No frontal sinus tenderness.     Left Sinus: No frontal sinus tenderness.     Mouth/Throat:     Mouth: Mucous membranes are moist.     Pharynx: Oropharynx is clear. No oropharyngeal exudate  or posterior oropharyngeal erythema.  Cardiovascular:     Rate and Rhythm: Normal rate and regular rhythm.  Pulmonary:     Effort: Pulmonary effort is normal.     Breath sounds: No rhonchi or rales.  Abdominal:     General: Abdomen is flat. Bowel sounds are normal. There is no distension.     Palpations: Abdomen is soft.     Tenderness: There is no abdominal tenderness.  Musculoskeletal:        General: Normal range of motion.     Right lower leg: No edema.     Left lower leg:  No edema.  Skin:    General: Skin is warm and dry.     Capillary Refill: Capillary refill takes less than 2 seconds.  Neurological:     General: No focal deficit present.     Mental Status: She is alert and oriented to person, place, and time.  Psychiatric:        Mood and Affect: Mood normal.        Behavior: Behavior normal.        04/17/2024    1:57 PM 04/05/2022   11:01 AM 07/12/2021    9:38 AM  Depression screen PHQ 2/9  Decreased Interest 0 0 0  Down, Depressed, Hopeless 0 0 0  PHQ - 2 Score 0 0 0  Altered sleeping  0 0  Tired, decreased energy  0 0  Change in appetite  0 0  Feeling bad or failure about yourself   0 0  Trouble concentrating  0 0  Moving slowly or fidgety/restless  0 0  Suicidal thoughts  0 0  PHQ-9 Score  0 0  Difficult doing work/chores  Not difficult at all Not difficult at all       04/17/2024    1:57 PM 04/05/2022   11:01 AM 07/12/2021    9:38 AM 10/26/2020    2:11 PM  GAD 7 : Generalized Anxiety Score  Nervous, Anxious, on Edge 0 0 0 0  Control/stop worrying 0 0 0 0  Worry too much - different things  0 0 0  Trouble relaxing  0 0 0  Restless  0 0 0  Easily annoyed or irritable  0 0 0  Afraid - awful might happen  0 0 0  Total GAD 7 Score  0 0 0  Anxiety Difficulty  Not difficult at all Not difficult at all     No results found for any visits on 04/17/24.  Last CBC Lab Results  Component Value Date   WBC 7.5 07/21/2023   HGB 16.1 (H) 07/21/2023   HCT 49.4 (H) 07/21/2023   MCV 100.8 (H) 07/21/2023   MCH 32.9 07/21/2023   RDW 12.1 07/21/2023   PLT 221 07/21/2023   Last metabolic panel Lab Results  Component Value Date   GLUCOSE 117 (H) 07/21/2023   NA 141 07/21/2023   K 4.0 07/21/2023   CL 99 07/21/2023   CO2 28 07/21/2023   BUN 23 07/21/2023   CREATININE 0.69 07/21/2023   GFRNONAA >60 07/21/2023   CALCIUM 9.8 07/21/2023   PHOS 3.5 04/17/2017   PROT 7.3 12/24/2014   ALBUMIN 5.3 (H) 04/17/2017   BILITOT 0.3 12/24/2014    ALKPHOS 83 12/24/2014   AST 22 12/24/2014   ALT 15 12/24/2014   ANIONGAP 14 07/21/2023   Last lipids No results found for: CHOL, HDL, LDLCALC, LDLDIRECT, TRIG, CHOLHDL Last hemoglobin A1c No results found for: HGBA1C  The ASCVD Risk score (Arnett DK, et al., 2019) failed to calculate for the following reasons:   Cannot find a previous HDL lab   Cannot find a previous total cholesterol lab    Assessment & Plan:  Chronic obstructive pulmonary disease with emphysema, unspecified emphysema type (HCC) Assessment & Plan: No spirometry in the past. Recent exacerbation on doxy and prednisone . Overall recovering but still has a persistent cough. No significant dyspnea or wheezing. Given multiple exacerbations this year will start on breztri. Continue albuterol  as needed. Is no longer smoking, encouraged her to continue this. Likley would benefit from spirometry once she is feeling back to baseline.    Subacute maxillary sinusitis Assessment & Plan: Continue to have rhinorrhea with post nasal drip. She will try flonase and nasal irrigation to see if this improves sx. Possible allergic component given exposure to hay and dust at work, works at a [employer]. However has not had difficulties with allergies in the past.    Other orders -     [employer]; Inhale 2 puffs into the lungs 2 (two) times daily.  Dispense: 10.7 g; Refill: 11     Return in about 4 weeks (around 05/15/2024).    Harlene Saddler, MD

## 2024-04-17 NOTE — Assessment & Plan Note (Addendum)
 No spirometry in the past. Recent exacerbation on doxy and prednisone . Overall recovering but still has a persistent cough. No significant dyspnea or wheezing. Given multiple exacerbations this year will start on breztri. Continue albuterol  as needed. Is no longer smoking, encouraged her to continue this. Likley would benefit from spirometry once she is feeling back to baseline.

## 2024-05-21 ENCOUNTER — Ambulatory Visit: Admitting: Student

## 2024-05-21 ENCOUNTER — Encounter: Payer: Self-pay | Admitting: Student

## 2024-05-21 VITALS — BP 126/74 | HR 86 | Ht 64.0 in | Wt 104.0 lb

## 2024-05-21 DIAGNOSIS — J439 Emphysema, unspecified: Secondary | ICD-10-CM | POA: Diagnosis not present

## 2024-05-21 NOTE — Progress Notes (Signed)
 Established Patient Office Visit  Subjective   Patient ID: Lisa Goodwin, female    DOB: 14-Oct-1955  Age: 68 y.o. MRN: [id number]  Chief Complaint  Patient presents with   COPD    Lisa Goodwin is a 68 y.o. person with medical hx listed below who presents today for transfer of care.Feels breathing improves, Reports cough is at baseline, no wheezing, dyspnea, or significant sputum production. Currently on Augmentin  for dental procedure.   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   budesonide -glycopyrrolate-formoterol (BREZTRI  AEROSPHERE) 160-9-4.8 MCG/ACT AERO inhaler Inhale 2 puffs into the lungs 2 (two) times daily.   [DISCONTINUED] ipratropium (ATROVENT ) 0.03 % nasal spray Place 2 sprays into both nostrils every 12 (twelve) hours. (Patient not taking: Reported on 05/21/2024)   No facility-administered encounter medications on file as of 05/21/2024.    BP 126/74   Pulse 86   Ht 5' 4 (1.626 m)   Wt 104 lb (47.2 kg)   SpO2 96%   BMI 17.85 kg/m  BP Readings from Last 3 Encounters:  05/21/24 126/74  04/17/24 126/80  07/22/23 (!) 140/91    Physical Exam Constitutional:      Appearance: Normal appearance.  HENT:     Mouth/Throat:     Mouth: Mucous membranes are moist.     Pharynx: Oropharynx is clear.  Eyes:     Extraocular Movements: Extraocular movements intact.     Pupils: Pupils are equal, round, and reactive to light.  Cardiovascular:     Rate and Rhythm: Normal rate and regular rhythm.  Pulmonary:     Effort: Pulmonary effort is normal. No respiratory distress.     Breath sounds: No rhonchi or  rales.     Comments: Decreased breath sounds of the bases Abdominal:     General: Abdomen is flat. Bowel sounds are normal. There is no distension.     Palpations: Abdomen is soft.     Tenderness: There is no abdominal tenderness.  Musculoskeletal:        General: Normal range of motion.     Right lower leg: No edema.     Left lower leg: No edema.  Skin:    General: Skin is warm and dry.     Capillary Refill: Capillary refill takes less than 2 seconds.  Neurological:     General: No focal deficit present.     Mental Status: She is alert and oriented to person, place, and time.  Psychiatric:        Mood and Affect: Mood normal.        Behavior: Behavior normal.        05/21/2024    9:14 AM 04/17/2024    1:57 PM 04/05/2022   11:01 AM  Depression screen PHQ 2/9  Decreased Interest 0 0 0  Down, Depressed, Hopeless 0 0 0  PHQ - 2 Score 0 0 0  Altered sleeping   0  Tired, decreased energy   0  Change in appetite   0  Feeling bad or failure about yourself    0  Trouble concentrating   0  Moving slowly or fidgety/restless   0  Suicidal thoughts   0  PHQ-9 Score   0   Difficult doing work/chores   Not difficult at all     Data saved with a previous flowsheet row definition       05/21/2024    9:14 AM 04/17/2024    1:57 PM 04/05/2022   11:01 AM 07/12/2021    9:38 AM  GAD 7 : Generalized Anxiety Score  Nervous, Anxious, on Edge 0 0 0 0  Control/stop worrying 0 0 0 0  Worry too much - different things   0 0  Trouble relaxing   0 0  Restless   0 0  Easily annoyed or irritable   0 0  Afraid - awful might happen   0 0  Total GAD 7 Score   0 0  Anxiety Difficulty   Not difficult at all Not difficult at all    No results found for any visits on 05/21/24.  Last CBC Lab Results  Component Value Date   WBC 7.5 07/21/2023   HGB 16.1 (H) 07/21/2023   HCT 49.4 (H) 07/21/2023   MCV 100.8 (H) 07/21/2023   MCH 32.9 07/21/2023   RDW 12.1 07/21/2023   PLT 221 07/21/2023    Last metabolic panel Lab Results  Component Value Date   GLUCOSE 117 (H) 07/21/2023   NA 141 07/21/2023   K 4.0 07/21/2023   CL 99 07/21/2023   CO2 28 07/21/2023   BUN 23 07/21/2023   CREATININE 0.69 07/21/2023   GFRNONAA >60 07/21/2023   CALCIUM 9.8 07/21/2023   PHOS 3.5 04/17/2017   PROT 7.3 12/24/2014   ALBUMIN 5.3 (H) 04/17/2017   BILITOT 0.3 12/24/2014   ALKPHOS 83 12/24/2014   AST 22 12/24/2014   ALT 15 12/24/2014   ANIONGAP 14 07/21/2023   Last thyroid  functions Lab Results  Component Value Date   TSH 1.380 04/17/2017      The ASCVD Risk score (Arnett DK, et al., 2019) failed to calculate for the following reasons:   Cannot find a previous HDL lab   Cannot find a previous total cholesterol lab    Assessment & Plan:  Chronic obstructive pulmonary disease with emphysema, unspecified emphysema type (HCC) Assessment & Plan: Using Breztri  1 puff in morning and sometimes at night. Using albuterol  every other day. Typically uses it before going to the gym. Will have use breztri  2 puffs BID. Referral to pulm for spirometry.   Orders: -     Pulmonary Visit     Return in about 6 months (around 11/18/2024) for physical.    Harlene Saddler, MD

## 2024-05-21 NOTE — Assessment & Plan Note (Addendum)
 Using Breztri  1 puff in morning and sometimes at night. Using albuterol  every other day. Typically uses it before going to the gym. Will have use breztri  2 puffs BID. Referral to pulm for spirometry.

## 2024-06-06 NOTE — Progress Notes (Signed)
 Lisa Goodwin                                          MRN: [id number]   06/06/2024   The [facility] Quality Team Specialist reviewed this patient medical record for the purposes of chart review for care gap closure. The following were reviewed: chart review for care gap closure-breast cancer screening.    [facility] Quality Team

## 2024-07-17 ENCOUNTER — Encounter: Admitting: Student

## 2024-07-17 NOTE — Progress Notes (Signed)
 Lisa Goodwin                                          MRN: [id number]   07/17/2024   The [facility] Quality Team Specialist reviewed this patient medical record for the purposes of chart review for care gap closure. The following were reviewed: chart review for care gap closure-breast cancer screening.    [facility] Quality Team

## 2024-07-31 ENCOUNTER — Ambulatory Visit: Admitting: Student in an Organized Health Care Education/Training Program

## 2024-11-20 ENCOUNTER — Encounter: Admitting: Student
